# Patient Record
Sex: Male | Born: 1960 | Race: Black or African American | Hispanic: No | Marital: Single | Smoking: Current every day smoker
Health system: Southern US, Community
[De-identification: ages and names within clinical notes are randomized; demographics above are authoritative.]

## PROBLEM LIST (undated history)

## (undated) HISTORY — PX: APPENDECTOMY: SHX54

---

## 2005-07-22 ENCOUNTER — Emergency Department: Payer: Self-pay | Admitting: Emergency Medicine

## 2005-11-28 ENCOUNTER — Emergency Department: Payer: Self-pay | Admitting: Emergency Medicine

## 2008-12-23 ENCOUNTER — Emergency Department: Payer: Self-pay | Admitting: Emergency Medicine

## 2010-06-07 ENCOUNTER — Emergency Department: Payer: Self-pay | Admitting: Emergency Medicine

## 2010-06-23 ENCOUNTER — Ambulatory Visit: Payer: Self-pay | Admitting: Family Medicine

## 2013-08-14 ENCOUNTER — Emergency Department: Payer: Self-pay | Admitting: Emergency Medicine

## 2014-10-05 ENCOUNTER — Emergency Department: Payer: Self-pay | Admitting: Emergency Medicine

## 2020-02-07 ENCOUNTER — Ambulatory Visit: Payer: Self-pay | Attending: Internal Medicine

## 2020-02-07 DIAGNOSIS — Z23 Encounter for immunization: Secondary | ICD-10-CM

## 2020-02-07 NOTE — Progress Notes (Signed)
   Covid-19 Vaccination Clinic  Name:  Adam Neal    MRN: [id number] DOB: Mar 07, 1961  02/07/2020  [name] was observed post Covid-19 immunization for 15 minutes without incident. He was provided with Vaccine Information Sheet and instruction to access the V-Safe system.   [name] was instructed to call 911 with any severe reactions post vaccine: Marland Kitchen Difficulty breathing  . Swelling of face and throat  . A fast heartbeat  . A bad rash all over body  . Dizziness and weakness   Immunizations Administered    Name Date Dose VIS Date Route   Pfizer COVID-19 Vaccine 02/07/2020  8:50 AM 0.3 mL 11/01/2019 Intramuscular   Manufacturer: [employer], [employer]   Lot: [id number]   NDC: [address]-9

## 2020-03-03 ENCOUNTER — Ambulatory Visit: Payer: Self-pay | Attending: Internal Medicine

## 2020-03-03 DIAGNOSIS — Z23 Encounter for immunization: Secondary | ICD-10-CM

## 2020-03-03 NOTE — Progress Notes (Signed)
   Covid-19 Vaccination Clinic  Name:  Adam Neal    MRN: [id number] DOB: 1961-06-30  03/03/2020  [name] was observed post Covid-19 immunization for 15 minutes without incident. He was provided with Vaccine Information Sheet and instruction to access the V-Safe system.   [name] was instructed to call 911 with any severe reactions post vaccine: Marland Kitchen Difficulty breathing  . Swelling of face and throat  . A fast heartbeat  . A bad rash all over body  . Dizziness and weakness   Immunizations Administered    Name Date Dose VIS Date Route   Pfizer COVID-19 Vaccine 03/03/2020  9:31 AM 0.3 mL 11/01/2019 Intramuscular   Manufacturer: [employer], [employer]   Lot: [id number]   NDC: [address]-9

## 2020-03-04 ENCOUNTER — Ambulatory Visit: Payer: Self-pay

## 2021-07-04 ENCOUNTER — Inpatient Hospital Stay: Payer: Self-pay

## 2021-07-04 ENCOUNTER — Other Ambulatory Visit: Payer: Self-pay

## 2021-07-04 ENCOUNTER — Inpatient Hospital Stay
Admission: EM | Admit: 2021-07-04 | Discharge: 2021-07-09 | DRG: 872 | Disposition: A | Discharge status: Home / Self Care | Payer: Self-pay | Attending: Internal Medicine | Admitting: Internal Medicine

## 2021-07-04 ENCOUNTER — Emergency Department: Payer: Self-pay

## 2021-07-04 DIAGNOSIS — E871 Hypo-osmolality and hyponatremia: Secondary | ICD-10-CM

## 2021-07-04 DIAGNOSIS — W109XXA Fall (on) (from) unspecified stairs and steps, initial encounter: Secondary | ICD-10-CM | POA: Diagnosis present

## 2021-07-04 DIAGNOSIS — A419 Sepsis, unspecified organism: Principal | ICD-10-CM

## 2021-07-04 DIAGNOSIS — Y902 Blood alcohol level of 40-59 mg/100 ml: Secondary | ICD-10-CM | POA: Diagnosis present

## 2021-07-04 DIAGNOSIS — Z20822 Contact with and (suspected) exposure to covid-19: Secondary | ICD-10-CM | POA: Diagnosis present

## 2021-07-04 DIAGNOSIS — R17 Unspecified jaundice: Secondary | ICD-10-CM

## 2021-07-04 DIAGNOSIS — D649 Anemia, unspecified: Secondary | ICD-10-CM

## 2021-07-04 DIAGNOSIS — K760 Fatty (change of) liver, not elsewhere classified: Secondary | ICD-10-CM | POA: Diagnosis present

## 2021-07-04 DIAGNOSIS — E872 Acidosis, unspecified: Secondary | ICD-10-CM

## 2021-07-04 DIAGNOSIS — L03115 Cellulitis of right lower limb: Secondary | ICD-10-CM

## 2021-07-04 DIAGNOSIS — E876 Hypokalemia: Secondary | ICD-10-CM

## 2021-07-04 DIAGNOSIS — R739 Hyperglycemia, unspecified: Secondary | ICD-10-CM

## 2021-07-04 DIAGNOSIS — R945 Abnormal results of liver function studies: Secondary | ICD-10-CM

## 2021-07-04 DIAGNOSIS — E46 Unspecified protein-calorie malnutrition: Secondary | ICD-10-CM | POA: Diagnosis present

## 2021-07-04 DIAGNOSIS — F10230 Alcohol dependence with withdrawal, uncomplicated: Secondary | ICD-10-CM | POA: Diagnosis present

## 2021-07-04 DIAGNOSIS — B852 Pediculosis, unspecified: Secondary | ICD-10-CM | POA: Diagnosis present

## 2021-07-04 DIAGNOSIS — Z720 Tobacco use: Secondary | ICD-10-CM

## 2021-07-04 DIAGNOSIS — F17210 Nicotine dependence, cigarettes, uncomplicated: Secondary | ICD-10-CM | POA: Diagnosis present

## 2021-07-04 DIAGNOSIS — F1010 Alcohol abuse, uncomplicated: Secondary | ICD-10-CM

## 2021-07-04 DIAGNOSIS — Z681 Body mass index (BMI) 19 or less, adult: Secondary | ICD-10-CM

## 2021-07-04 DIAGNOSIS — F10930 Alcohol use, unspecified with withdrawal, uncomplicated: Secondary | ICD-10-CM

## 2021-07-04 DIAGNOSIS — L0390 Cellulitis, unspecified: Secondary | ICD-10-CM | POA: Diagnosis present

## 2021-07-04 LAB — COMPREHENSIVE METABOLIC PANEL
ALT: 23 U/L (ref 0–44)
AST: 37 U/L (ref 15–41)
Albumin: 3.5 g/dL (ref 3.5–5.0)
Alkaline Phosphatase: 22 U/L — ABNORMAL LOW (ref 38–126)
Anion gap: 18 — ABNORMAL HIGH (ref 5–15)
BUN: 15 mg/dL (ref 8–23)
CO2: 23 mmol/L (ref 22–32)
Calcium: 8.8 mg/dL — ABNORMAL LOW (ref 8.9–10.3)
Chloride: 86 mmol/L — ABNORMAL LOW (ref 98–111)
Creatinine, Ser: 0.78 mg/dL (ref 0.61–1.24)
GFR, Estimated: >60 mL/min (ref >60)
Glucose, Bld: 102 mg/dL — ABNORMAL HIGH (ref 70–99)
Potassium: 3.1 mmol/L — ABNORMAL LOW (ref 3.5–5.1)
Sodium: 127 mmol/L — ABNORMAL LOW (ref 135–145)
Total Bilirubin: 1.6 mg/dL — ABNORMAL HIGH (ref 0.3–1.2)
Total Protein: 8.7 g/dL — ABNORMAL HIGH (ref 6.5–8.1)

## 2021-07-04 LAB — IRON AND TIBC
Iron: 43 ug/dL — ABNORMAL LOW (ref 45–182)
Saturation Ratios: 24 % (ref 17.9–39.5)
TIBC: 178 ug/dL — ABNORMAL LOW (ref 250–450)
UIBC: 135 ug/dL

## 2021-07-04 LAB — HEMOGLOBIN A1C
Hgb A1c MFr Bld: 4.9 % (ref 4.8–5.6)
Mean Plasma Glucose: 93.93 mg/dL

## 2021-07-04 LAB — FOLATE: Folate: 14.3 ng/mL (ref >5.9)

## 2021-07-04 LAB — CBC WITH DIFFERENTIAL/PLATELET
Abs Immature Granulocytes: 0.1 10*3/uL — ABNORMAL HIGH (ref 0.00–0.07)
Basophils Absolute: 0 10*3/uL (ref 0.0–0.1)
Basophils Relative: 0 %
Eosinophils Absolute: 0.2 10*3/uL (ref 0.0–0.5)
Eosinophils Relative: 1 %
HCT: 32.6 % — ABNORMAL LOW (ref 39.0–52.0)
Hemoglobin: 11.9 g/dL — ABNORMAL LOW (ref 13.0–17.0)
Immature Granulocytes: 1 %
Lymphocytes Relative: 16 %
Lymphs Abs: 2.2 10*3/uL (ref 0.7–4.0)
MCH: 37.2 pg — ABNORMAL HIGH (ref 26.0–34.0)
MCHC: 36.5 g/dL — ABNORMAL HIGH (ref 30.0–36.0)
MCV: 101.9 fL — ABNORMAL HIGH (ref 80.0–100.0)
Monocytes Absolute: 2 10*3/uL — ABNORMAL HIGH (ref 0.1–1.0)
Monocytes Relative: 15 %
Neutro Abs: 8.9 10*3/uL — ABNORMAL HIGH (ref 1.7–7.7)
Neutrophils Relative %: 67 %
Platelets: 235 10*3/uL (ref 150–400)
RBC: 3.2 MIL/uL — ABNORMAL LOW (ref 4.22–5.81)
RDW: 12.8 % (ref 11.5–15.5)
WBC: 13.4 10*3/uL — ABNORMAL HIGH (ref 4.0–10.5)
nRBC: 0.1 % (ref 0.0–0.2)

## 2021-07-04 LAB — GLUCOSE, CAPILLARY: Glucose-Capillary: 98 mg/dL (ref 70–99)

## 2021-07-04 LAB — ETHANOL: Alcohol, Ethyl (B): 45 mg/dL — ABNORMAL HIGH (ref <10)

## 2021-07-04 LAB — LACTIC ACID, PLASMA
Lactic Acid, Venous: 2 mmol/L (ref 0.5–1.9)
Lactic Acid, Venous: 2.1 mmol/L (ref 0.5–1.9)

## 2021-07-04 MED ORDER — GADOBUTROL 1 MMOL/ML IV SOLN
6.0000 mL | Freq: Once | INTRAVENOUS | Status: AC | PRN
  Administered 2021-07-04: 6 mL via INTRAVENOUS

## 2021-07-04 MED ORDER — VANCOMYCIN HCL IN DEXTROSE 1-5 GM/200ML-% IV SOLN
1000.0000 mg | Freq: Once | INTRAVENOUS | Status: DC
  Filled 2021-07-04: qty 200

## 2021-07-04 MED ORDER — LORAZEPAM 2 MG/ML IJ SOLN: 1.0000 mg | INTRAMUSCULAR | Status: AC | PRN

## 2021-07-04 MED ORDER — THIAMINE HCL 100 MG PO TABS
100.0000 mg | ORAL_TABLET | Freq: Every day | ORAL | Status: DC
  Administered 2021-07-04 – 2021-07-08 (×5): 100 mg via ORAL
  Filled 2021-07-04 (×5): qty 1

## 2021-07-04 MED ORDER — FOLIC ACID 1 MG PO TABS
1.0000 mg | ORAL_TABLET | Freq: Every day | ORAL | Status: DC
  Administered 2021-07-04 – 2021-07-08 (×5): 1 mg via ORAL
  Filled 2021-07-04 (×5): qty 1

## 2021-07-04 MED ORDER — LORAZEPAM 1 MG PO TABS: 1.0000 mg | ORAL_TABLET | ORAL | Status: AC | PRN

## 2021-07-04 MED ORDER — THIAMINE HCL 100 MG/ML IJ SOLN: 100.0000 mg | Freq: Every day | INTRAMUSCULAR | Status: DC

## 2021-07-04 MED ORDER — VANCOMYCIN HCL 1250 MG/250ML IV SOLN
1250.0000 mg | Freq: Once | INTRAVENOUS | Status: AC
  Administered 2021-07-04: 1250 mg via INTRAVENOUS
  Filled 2021-07-04: qty 250

## 2021-07-04 MED ORDER — ADULT MULTIVITAMIN W/MINERALS CH
1.0000 | ORAL_TABLET | Freq: Every day | ORAL | Status: DC
  Administered 2021-07-04 – 2021-07-08 (×5): 1 via ORAL
  Filled 2021-07-04 (×5): qty 1

## 2021-07-04 MED ORDER — PIPERACILLIN-TAZOBACTAM 3.375 G IVPB 30 MIN
3.3750 g | Freq: Once | INTRAVENOUS | Status: AC
  Administered 2021-07-04: 3.375 g via INTRAVENOUS
  Filled 2021-07-04: qty 50

## 2021-07-04 MED ORDER — VANCOMYCIN HCL 750 MG/150ML IV SOLN
750.0000 mg | Freq: Two times a day (BID) | INTRAVENOUS | Status: DC
  Administered 2021-07-05: 08:00:00 750 mg via INTRAVENOUS
  Filled 2021-07-04 (×2): qty 150

## 2021-07-04 MED ORDER — LORAZEPAM 2 MG/ML IJ SOLN
1.0000 mg | Freq: Once | INTRAMUSCULAR | Status: AC
  Administered 2021-07-04: 1 mg via INTRAVENOUS
  Filled 2021-07-04: qty 1

## 2021-07-04 MED ORDER — PIPERACILLIN-TAZOBACTAM 3.375 G IVPB
3.3750 g | Freq: Three times a day (TID) | INTRAVENOUS | Status: DC
  Administered 2021-07-05: 05:00:00 3.375 g via INTRAVENOUS
  Filled 2021-07-04: qty 50

## 2021-07-04 MED ORDER — LACTATED RINGERS IV SOLN: INTRAVENOUS | Status: DC

## 2021-07-04 MED ORDER — POTASSIUM CHLORIDE CRYS ER 20 MEQ PO TBCR
40.0000 meq | EXTENDED_RELEASE_TABLET | Freq: Once | ORAL | Status: AC
  Administered 2021-07-04: 40 meq via ORAL
  Filled 2021-07-04: qty 2

## 2021-07-04 MED ORDER — LACTATED RINGERS IV BOLUS
1000.0000 mL | Freq: Once | INTRAVENOUS | Status: AC
  Administered 2021-07-04: 1000 mL via INTRAVENOUS

## 2021-07-04 NOTE — ED Notes (Signed)
Pt being transported to MRI at this time, IV abx to resume on pt return to room.

## 2021-07-04 NOTE — ED Triage Notes (Addendum)
Pt arrives via ems from home. Pt reports wound on rt ankle that is open. Would appears to have been partially wrapped. Drainage noted with foul odor. Pt a&o in triage. Pt denies pain. NAD noted at this time. Pt unsure how long wound has been present.

## 2021-07-04 NOTE — ED Provider Notes (Signed)
[facility] Emergency Department Provider Note   ____________________________________________   Event Date/Time   First MD Initiated Contact with Patient 07/04/21 1733     (approximate)  I have reviewed the triage vital signs and the nursing notes.   HISTORY  Chief Complaint Wound Infection    HPI Adam Neal is a 60 y.o. male with no reported past medical history who presents to the ED complaining of foot wound.  Patient states that 5 days ago he started to notice pain and swelling around his right ankle.  He states that the foot is painful for him to bear weight on and he has noticed drainage from the ankle.  He denies any fevers, chills, nausea, or vomiting.  He denies any history of diabetes.  He denies any recent injuries to his right foot or ankle.  He does admit to daily alcohol consumption, reports a couple of beers and 1 liquor drink per day.        History reviewed. No pertinent past medical history.  There are no problems to display for this patient.   Past Surgical History:  Procedure Laterality Date   APPENDECTOMY      Prior to Admission medications   Not on File    Allergies Patient has no allergy information on record.  History reviewed. No pertinent family history.  Social History Social History   Tobacco Use   Smoking status: Every Day    Types: Cigarettes  Substance Use Topics   Alcohol use: Yes   Drug use: Yes    Types: Marijuana    Review of Systems  Constitutional: No fever/chills Eyes: No visual changes. ENT: No sore throat. Cardiovascular: Denies chest pain. Respiratory: Denies shortness of breath. Gastrointestinal: No abdominal pain.  No nausea, no vomiting.  No diarrhea.  No constipation. Genitourinary: Negative for dysuria. Musculoskeletal: Negative for back pain.  Positive for right foot and ankle pain and swelling. Skin: Negative for rash. Neurological: Negative for headaches, focal weakness  or numbness.  ____________________________________________   PHYSICAL EXAM:  VITAL SIGNS: ED Triage Vitals  Enc Vitals Group     BP 07/04/21 1456 103/74     Pulse Rate 07/04/21 1456 (!) 103     Resp 07/04/21 1456 20     Temp 07/04/21 1456 (!) 97.5 F (36.4 C)     Temp Source 07/04/21 1456 Oral     SpO2 07/04/21 1456 100 %     Weight 07/04/21 1458 135 lb (61.2 kg)     Height 07/04/21 1458 5\' 7"  (1.702 m)     Head Circumference --      Peak Flow --      Pain Score 07/04/21 1458 0     Pain Loc --      Pain Edu? --      Excl. in GC? --     Constitutional: Alert and oriented. Eyes: Conjunctivae are normal. Head: Atraumatic. Nose: No congestion/rhinnorhea. Mouth/Throat: Mucous membranes are moist. Neck: Normal ROM Cardiovascular: Normal rate, regular rhythm. Grossly normal heart sounds. Respiratory: Normal respiratory effort.  No retractions. Lungs CTAB. Gastrointestinal: Soft and nontender. No distention. Genitourinary: deferred Musculoskeletal: Edema, erythema, and warmth noted to distal right lower extremity at mid calf and extending throughout right foot.  Ulceration noted between all 5 toes with maggots.  There is also ulceration circumferentially around his right ankle. Neurologic:  Normal speech and language. No gross focal neurologic deficits are appreciated.  Tremulous with tongue fasciculations. Skin:  Skin is  warm, dry and intact. No rash noted. Psychiatric: Mood and affect are normal. Speech and behavior are normal.  ____________________________________________   LABS (all labs ordered are listed, but only abnormal results are displayed)  Labs Reviewed  LACTIC ACID, PLASMA - Abnormal; Notable for the following components:      Result Value   Lactic Acid, Venous 2.0 (*)    All other components within normal limits  COMPREHENSIVE METABOLIC PANEL - Abnormal; Notable for the following components:   Sodium 127 (*)    Potassium 3.1 (*)    Chloride 86 (*)     Glucose, Bld 102 (*)    Calcium 8.8 (*)    Total Protein 8.7 (*)    Alkaline Phosphatase 22 (*)    Total Bilirubin 1.6 (*)    Anion gap 18 (*)    All other components within normal limits  CBC WITH DIFFERENTIAL/PLATELET - Abnormal; Notable for the following components:   WBC 13.4 (*)    RBC 3.20 (*)    Hemoglobin 11.9 (*)    HCT 32.6 (*)    MCV 101.9 (*)    MCH 37.2 (*)    MCHC 36.5 (*)    Neutro Abs 8.9 (*)    Monocytes Absolute 2.0 (*)    Abs Immature Granulocytes 0.10 (*)    All other components within normal limits  SARS CORONAVIRUS 2 (TAT 6-24 HRS)  CULTURE, BLOOD (ROUTINE X 2)  CULTURE, BLOOD (ROUTINE X 2)  LACTIC ACID, PLASMA  URINALYSIS, COMPLETE (UACMP) WITH MICROSCOPIC  ETHANOL    PROCEDURES  Procedure(s) performed (including Critical Care):  Procedures   ____________________________________________   INITIAL IMPRESSION / ASSESSMENT AND PLAN / ED COURSE      60 year old male with no reported past medical history presents to the ED complaining of pain and swelling in his right ankle and foot.  He has significant ulceration between all 5 of his toes as well as circumferentially around his ankle, maggots noted between his toes.  X-ray of right ankle reviewed by me with no fracture or dislocation, no obvious signs of osteomyelitis however I am concerned that he is developing osteomyelitis with the severity of his ulceration and wounds.  We will start on broad-spectrum antibiotics, send blood cultures.  We will hydrate with IV fluids and patient also appears to be developing early alcohol withdrawal, will give dose of IV Ativan.  Plan to discuss with hospitalist for admission.      ____________________________________________   FINAL CLINICAL IMPRESSION(S) / ED DIAGNOSES  Final diagnoses:  Cellulitis of right foot  Alcohol withdrawal syndrome without complication [facility])     ED Discharge Orders     None        Note:  This document was prepared using  Dragon voice recognition software and may include unintentional dictation errors.    Chesley Noon, MD 07/04/21 [phone]

## 2021-07-04 NOTE — H&P (Signed)
History and Physical    Adam Neal ZDG:[id number] DOB: Oct 23, 1961 DOA: 07/04/2021  PCP: Pcp, No  Patient coming from: Home  I have personally briefly reviewed patient's old medical records in [facility]  Chief Complaint: right   HPI: Adam Neal is a 60 y.o. male with no reported medical history who presents with concerns of right ankle wound.  Patient has not seen a physician for many years.  States he fell about a week ago while walking up the stairs and injured his right ankle.  Then about 4 days ago he noticed purulent drainage coming from his right foot.  Today he was advised by a friend to be evaluated in the ED.  He denies feeling any pain but also denies any numbness or tingling.  Has been having chills but no fever.  No nausea, vomiting or diarrhea.  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  He endorsed half a pack tobacco use for about 40 years.  Also drinks about half a pint of alcohol daily for the past 40 years.  Last drink was this morning.  Has been feeling tremulous since this afternoon.  Also endorsed marijuana use but a week ago.  ED Course: He was afebrile, mildly tachycardic and normotensive on room air.  WBC of 13.4, hemoglobin of 11.9.  Lactate of 2. Sodium of 127, K of 3.1, normal creatinine of 0.70, BG 102 with gap of 18.  Total bilirubin mildly elevated 1.6.  LFTs otherwise normal.  Patient was noted to have a malodorous wound wrapping around his right ankle circumferentially with maggots around his toes. X-ray of the right ankle was negative for osteomyelitis.  He was started on IV vancomycin and Zosyn for cellulitis and hospitalist Admission.  Review of Systems: Constitutional: No Weight Change, No Fever ENT/Mouth: No sore throat, No Rhinorrhea Eyes: No Eye Pain, No Vision Changes Cardiovascular: No Chest Pain, no SOB Respiratory: No Cough  Gastrointestinal: No Nausea, No Vomiting, No Diarrhea, No Pain Genitourinary: no  Urinary Incontinence Musculoskeletal: No Arthralgias, No Myalgias Skin: No Skin Lesions, No Pruritus, Neuro: no Weakness, No Numbness Psych: No Anxiety/Panic, No Depression, no decrease appetite Heme/Lymph: No Bruising, No Bleeding  Past Surgical History:  Procedure Laterality Date   APPENDECTOMY       reports that he has been smoking cigarettes. He does not have any smokeless tobacco history on file. He reports current alcohol use. He reports current drug use. Drug: Marijuana. Social History  Not on File  History reviewed. No pertinent family history.   Prior to Admission medications   Not on File    Physical Exam: Vitals:   07/04/21 1456 07/04/21 1458  BP: 103/74   Pulse: (!) 103   Resp: 20   Temp: (!) 97.5 F (36.4 C)   TempSrc: Oral   SpO2: 100%   Weight:  61.2 kg  Height:  5\' 7"  (1.702 m)    Constitutional: NAD, calm, comfortable, nontoxic thin elderly male laying at 22 incline in bed Vitals:   07/04/21 1456 07/04/21 1458  BP: 103/74   Pulse: (!) 103   Resp: 20   Temp: (!) 97.5 F (36.4 C)   TempSrc: Oral   SpO2: 100%   Weight:  61.2 kg  Height:  5\' 7"  (1.702 m)   Eyes: PERRL, lids and conjunctivae normal ENMT: Mucous membranes are moist.  Poor and missing dentition throughout  neck: normal, supple Respiratory: clear to auscultation bilaterally, no wheezing, no crackles. Normal respiratory  effort. No accessory muscle use.  Cardiovascular: Regular rate and rhythm, no murmurs / rubs / gallops.  Bilateral nonpitting edema of the ankle-Patient had deep imprints of socks that he previously wore. abdomen: no tenderness, no masses palpated. Bowel sounds positive.  Musculoskeletal: no clubbing / cyanosis. No joint deformity upper and lower extremities.  Skin: Large erythematous circumferential wound wrapping around right distal lower extremity/ankle with purulent drainage and malodor.  There was also maggots between toes of his right foot. Neurologic: CN 2-12  grossly intact. Sensation intact,  Strength 5/5 in all 4.  Psychiatric: Normal judgment and insight. Alert and oriented x 3. Normal mood.     Labs on Admission: I have personally reviewed following labs and imaging studies  CBC: Recent Labs  Lab 07/04/21 1504  WBC 13.4*  NEUTROABS 8.9*  HGB 11.9*  HCT 32.6*  MCV 101.9*  PLT 235   Basic Metabolic Panel: Recent Labs  Lab 07/04/21 1504  NA 127*  K 3.1*  CL 86*  CO2 23  GLUCOSE 102*  BUN 15  CREATININE 0.78  CALCIUM 8.8*   GFR: Estimated Creatinine Clearance: 83.9 mL/min (by C-G formula based on SCr of 0.78 mg/dL). Liver Function Tests: Recent Labs  Lab 07/04/21 1504  AST 37  ALT 23  ALKPHOS 22*  BILITOT 1.6*  PROT 8.7*  ALBUMIN 3.5   No results for input(s): LIPASE, AMYLASE in the last 168 hours. No results for input(s): AMMONIA in the last 168 hours. Coagulation Profile: No results for input(s): INR, PROTIME in the last 168 hours. Cardiac Enzymes: No results for input(s): CKTOTAL, CKMB, CKMBINDEX, TROPONINI in the last 168 hours. BNP (last 3 results) No results for input(s): PROBNP in the last 8760 hours. HbA1C: No results for input(s): HGBA1C in the last 72 hours. CBG: No results for input(s): GLUCAP in the last 168 hours. Lipid Profile: No results for input(s): CHOL, HDL, LDLCALC, TRIG, CHOLHDL, LDLDIRECT in the last 72 hours. Thyroid Function Tests: No results for input(s): TSH, T4TOTAL, FREET4, T3FREE, THYROIDAB in the last 72 hours. Anemia Panel: No results for input(s): VITAMINB12, FOLATE, FERRITIN, TIBC, IRON, RETICCTPCT in the last 72 hours. Urine analysis: No results found for: COLORURINE, APPEARANCEUR, LABSPEC, PHURINE, GLUCOSEU, HGBUR, BILIRUBINUR, KETONESUR, PROTEINUR, UROBILINOGEN, NITRITE, LEUKOCYTESUR  Radiological Exams on Admission: DG Ankle Complete Right  Result Date: 07/04/2021 CLINICAL DATA:  Open wound on the right ankle. EXAM: RIGHT ANKLE - COMPLETE 3+ VIEW COMPARISON:  None.  FINDINGS: There is no evidence of fracture, dislocation, or joint effusion. No focal osseous demineralization to suggest osteomyelitis. A plantar calcaneal enthesophyte is noted. The bones are demineralized. There is soft tissue swelling surrounding the ankle. IMPRESSION: No evidence of osteomyelitis. Electronically Signed   By: Romona Curls M.D.   On: 07/04/2021 15:43      Assessment/Plan  Sepsis secondary to right ankle cellulitis -Patient presented with mild tachycardia and leukocytosis with cellulitis -X-ray negative for osteomyelitis but given the appearance of his wound with maggots and lack of medical care will obtain MRI of the right ankle to rule out osteomyelitis -Continue IV vancomycin and Zosyn -Wound consult  Hyponatremia - Likely due to heavy alcohol use - We will replete with IV fluids and repeat morning  Hypokalemia - Replete with oral supplementation  Mild hyperglycemia - Check hemoglobin A1c to assess for potential diabetes especially patient has not seek out medical care for many years  Metabolic acidosis - Gap of 18 - Likely secondary to heavy alcohol use.  We will gentle  hydrate with IV continuous fluid  Anemia - Hemoglobin of 11.9 - Will check iron panel, vitamin B12 and folate  Elevated total bilirubin - Bilirubin mildly elevated 1.6.  Likely due to heavy alcohol use.  Benign abdominal exam.  We will follow with repeat CMP in the morning.  Alcohol abuse - Patient endorsed using a pint of alcohol daily for the past 40 years -start CIWA protocol.  Has already received 2 mg of IV Ativan in the ED. -give thiamine, folate and multivitamin  Tobacco abuse -smoke 1/2 pack for 40 years. Give nicotine patch.   DVT prophylaxis:.Lovenox Code Status: Full Family Communication: Plan discussed with patient at bedside  disposition Plan: Home with observation Consults called:  Admission status: Observation  Level of care: Progressive Cardiac  Status is:  Inpatient  Remains inpatient appropriate because:Inpatient level of care appropriate due to severity of illness  Dispo: The patient is from: Home              Anticipated d/c is to: Home              Patient currently is not medically stable to d/c.   Difficult to place patient No         Anselm Jungling DO [facility]   If 7PM-7AM, please contact night-coverage [email or web address]   07/04/2021, 6:48 PM

## 2021-07-04 NOTE — ED Notes (Signed)
Pt returned to room, vitals updated, receiving RN made aware

## 2021-07-04 NOTE — ED Notes (Signed)
MD [facility] [place] informed of pt's lactic result at this time.

## 2021-07-04 NOTE — Progress Notes (Signed)
Pharmacy Antibiotic Note  Adam Neal is a 60 y.o. male admitted on 07/04/2021 with cellulitis. Pt noted to have significant ulceration between all 5 of his toes as well as circumferentially around his ankle, maggots noted between his toes. Pharmacy has been consulted for Vancomycin and Zosyn dosing.  8/14 R ankle Xray: No evidence of fracture, dislocation, or joint effusion. No evidence of osteomyelitis  Plan: Start Zosyn 3.375g IV q8h (4 hour infusion) Start vancomycin 1250 mg IV x1 loading dose followed by 750 mg q12h - plan for 7 days Est AUC: 459.6 Scr 0.8, Vd 0.72 L/kg, TBW for CrCl/Ke Obtain vancomycin levels around 4th or 5th dose if continued Monitor renal function and adjust dose as clinically indicated  Follow up MRI ankle    Height: 5\' 7"  (170.2 cm) Weight: 61.2 kg (135 lb) IBW/kg (Calculated) : 66.1  Temp (24hrs), Avg:97.5 F (36.4 C), Min:97.5 F (36.4 C), Max:97.5 F (36.4 C)  Recent Labs  Lab 07/04/21 1504  WBC 13.4*  CREATININE 0.78  LATICACIDVEN 2.0*    Estimated Creatinine Clearance: 83.9 mL/min (by C-G formula based on SCr of 0.78 mg/dL).    No Known Allergies  Antimicrobials this admission: 8/14 Zosyn >> 8/14 Vancomycin >>  Dose adjustments this admission:   Microbiology results: 8/14 BCx: sent   Thank you for allowing pharmacy to be a part of this patient's care.  9/14 Adam Neal 07/04/2021 7:13 PM

## 2021-07-05 DIAGNOSIS — L03115 Cellulitis of right lower limb: Secondary | ICD-10-CM

## 2021-07-05 DIAGNOSIS — F10230 Alcohol dependence with withdrawal, uncomplicated: Secondary | ICD-10-CM

## 2021-07-05 DIAGNOSIS — F10930 Alcohol use, unspecified with withdrawal, uncomplicated: Secondary | ICD-10-CM

## 2021-07-05 LAB — COMPREHENSIVE METABOLIC PANEL
ALT: 17 U/L (ref 0–44)
AST: 35 U/L (ref 15–41)
Albumin: 2.7 g/dL — ABNORMAL LOW (ref 3.5–5.0)
Alkaline Phosphatase: 19 U/L — ABNORMAL LOW (ref 38–126)
Anion gap: 13 (ref 5–15)
BUN: 11 mg/dL (ref 8–23)
CO2: 25 mmol/L (ref 22–32)
Calcium: 8.6 mg/dL — ABNORMAL LOW (ref 8.9–10.3)
Chloride: 92 mmol/L — ABNORMAL LOW (ref 98–111)
Creatinine, Ser: 0.78 mg/dL (ref 0.61–1.24)
GFR, Estimated: >60 mL/min (ref >60)
Glucose, Bld: 82 mg/dL (ref 70–99)
Potassium: 3.9 mmol/L (ref 3.5–5.1)
Sodium: 130 mmol/L — ABNORMAL LOW (ref 135–145)
Total Bilirubin: 2 mg/dL — ABNORMAL HIGH (ref 0.3–1.2)
Total Protein: 6.8 g/dL (ref 6.5–8.1)

## 2021-07-05 LAB — HIV ANTIBODY (ROUTINE TESTING W REFLEX): HIV Screen 4th Generation wRfx: NONREACTIVE

## 2021-07-05 LAB — URINALYSIS, COMPLETE (UACMP) WITH MICROSCOPIC
Bacteria, UA: NONE SEEN
Bilirubin Urine: NEGATIVE
Glucose, UA: NEGATIVE mg/dL
Hgb urine dipstick: NEGATIVE
Ketones, ur: 20 mg/dL — AB
Leukocytes,Ua: NEGATIVE
Nitrite: NEGATIVE
Protein, ur: NEGATIVE mg/dL
Specific Gravity, Urine: 1.034 — ABNORMAL HIGH (ref 1.005–1.030)
pH: 5 (ref 5.0–8.0)

## 2021-07-05 LAB — CBC
HCT: 29.2 % — ABNORMAL LOW (ref 39.0–52.0)
Hemoglobin: 10.7 g/dL — ABNORMAL LOW (ref 13.0–17.0)
MCH: 37.4 pg — ABNORMAL HIGH (ref 26.0–34.0)
MCHC: 36.6 g/dL — ABNORMAL HIGH (ref 30.0–36.0)
MCV: 102.1 fL — ABNORMAL HIGH (ref 80.0–100.0)
Platelets: 236 10*3/uL (ref 150–400)
RBC: 2.86 MIL/uL — ABNORMAL LOW (ref 4.22–5.81)
RDW: 12.7 % (ref 11.5–15.5)
WBC: 10.6 10*3/uL — ABNORMAL HIGH (ref 4.0–10.5)
nRBC: 0.2 % (ref 0.0–0.2)

## 2021-07-05 LAB — VITAMIN B12: Vitamin B-12: 219 pg/mL (ref 180–914)

## 2021-07-05 LAB — SARS CORONAVIRUS 2 (TAT 6-24 HRS): SARS Coronavirus 2: NEGATIVE

## 2021-07-05 MED ORDER — ENOXAPARIN SODIUM 40 MG/0.4ML IJ SOSY
40.0000 mg | PREFILLED_SYRINGE | INTRAMUSCULAR | Status: DC
  Administered 2021-07-05 – 2021-07-07 (×3): 40 mg via SUBCUTANEOUS
  Filled 2021-07-05 (×3): qty 0.4

## 2021-07-05 MED ORDER — NICOTINE 14 MG/24HR TD PT24
14.0000 mg | MEDICATED_PATCH | Freq: Every day | TRANSDERMAL | Status: DC
  Administered 2021-07-05 – 2021-07-08 (×4): 14 mg via TRANSDERMAL
  Filled 2021-07-05 (×4): qty 1

## 2021-07-05 MED ORDER — COVID-19 MRNA VAC-TRIS(PFIZER) 30 MCG/0.3ML IM SUSP
0.3000 mL | Freq: Once | INTRAMUSCULAR | Status: AC
  Filled 2021-07-05: qty 0.3

## 2021-07-05 MED ORDER — SILVER SULFADIAZINE 1 % EX CREA
TOPICAL_CREAM | Freq: Two times a day (BID) | CUTANEOUS | Status: DC
  Administered 2021-07-05 – 2021-07-07 (×3): 1 via TOPICAL
  Filled 2021-07-05 (×2): qty 85

## 2021-07-05 MED ORDER — VANCOMYCIN HCL 1250 MG/250ML IV SOLN
1250.0000 mg | INTRAVENOUS | Status: DC
  Administered 2021-07-05 – 2021-07-07 (×3): 1250 mg via INTRAVENOUS
  Filled 2021-07-05 (×4): qty 250

## 2021-07-05 MED ORDER — CYANOCOBALAMIN 1000 MCG/ML IJ SOLN
1000.0000 ug | Freq: Once | INTRAMUSCULAR | Status: AC
  Administered 2021-07-05: 1000 ug via INTRAMUSCULAR
  Filled 2021-07-05: qty 1

## 2021-07-05 NOTE — Progress Notes (Deleted)
Pt states his RLE wound from "a fall".

## 2021-07-05 NOTE — Consult Note (Signed)
WOC Nurse Consult Note: Patient receiving care in [facility]. Reason for Consult: right ankle wound and a "mushy left foot" Wound type: no wounds found on left foot or between toes of either foot.  There was a circumferential wound on the RLE at the level of the ankle and slightly above and below it. It was 13 cm in length and went completely around the RLE.  I asked the patient if he had any idea for what caused the wound, and he did not. Pressure Injury POA: Yes/No/NA Measurement: Wound bed: primarily pink, moist, but there are some areas of yellow and brown slough. Drainage (amount, consistency, odor) none Periwound: intact; no maggots found on either foot. Dressing procedure/placement/frequency:Apply Triple Paste to RLE wound areas. Cover with dry gauze, secure with kerlex. To be performed twice daily. WOC nurse will not follow at this time.  Please re-consult the WOC team if needed.  Helmut Muster, RN, MSN, CWOCN, CNS-BC, pager [phone]

## 2021-07-05 NOTE — Progress Notes (Signed)
Accidentally deleted my notes from this morning. They are still accurate.

## 2021-07-05 NOTE — TOC Initial Note (Addendum)
Transition of Care [facility]) - Initial/Assessment Note    Patient Details  Name: Adam Neal MRN: [id number] Date of Birth: Apr 12, 1961  [facility] CM/SW Contact:    Allayne Butcher, RN Phone Number: 07/05/2021, 2:59 PM  Clinical Narrative:                 Patient admitted to the hospital with wound cellulitis.  RNCM received consult for substance abuse counseling.  Patient's brother also requested to speak with RNCM.  Adam Neal patient's brother at the bedside today, he reports he wants to get help for his brother.  He reports that the patient just drinks alcohol all the time, patient does not work and does not have any income at this time he is year away from being able to receive social security.   Adam Neal reports that patient gets alcohol from his friends. Per Adam Neal the patient's home is uninhabitable with no running water.  Patient noted to have bed bugs and lice and maggots in his would at admission.   RNCM will reach out to DSS about home environment.  Brother given resources for substance abuse treatment and information on [facility] and [facility].  Referral will be made to both.   Brother is hoping that patient will go to their mother's at discharge as they do not want him to return to his home.     Expected Discharge Plan: Home w Home Health Services Barriers to Discharge: Continued Medical Work up   Patient Goals and CMS Choice        Expected Discharge Plan and Services Expected Discharge Plan: Home w Home Health Services   Discharge Planning Services: CM Consult, Indigent Health Clinic, Medication Assistance   Living arrangements for the past 2 months: Single Family Home                 DME Arranged: N/A DME Agency: NA                  Prior Living Arrangements/Services Living arrangements for the past 2 months: Single Family Home Lives with:: Self Patient language and need for interpreter reviewed:: Yes Do you feel safe  going back to the place where you live?: Yes      Need for Family Participation in Patient Care: Yes (Comment) (alcohol dependence, leg wound) Care giver support system in place?: Yes (comment) (brother and mother)   Criminal Activity/Legal Involvement Pertinent to Current Situation/Hospitalization: No - Comment as needed  Activities of Daily Living Home Assistive Devices/Equipment: None ADL Screening (condition at time of admission) Patient's cognitive ability adequate to safely complete daily activities?: No Is the patient deaf or have difficulty hearing?: No Does the patient have difficulty seeing, even when wearing glasses/contacts?: No Does the patient have difficulty concentrating, remembering, or making decisions?: Yes Patient able to express need for assistance with ADLs?: No Does the patient have difficulty dressing or bathing?: Yes Independently performs ADLs?: No Communication: Appropriate for developmental age Dressing (OT): Needs assistance Is this a change from baseline?: Change from baseline, expected to last <3days Grooming: Needs assistance Is this a change from baseline?: Change from baseline, expected to last <3 days Feeding: Independent Bathing: Needs assistance Is this a change from baseline?: Change from baseline, expected to last <3 days Toileting: Needs assistance Is this a change from baseline?: Change from baseline, expected to last <3 days In/Out Bed: Needs assistance Is this a change from baseline?: Change from baseline, expected to last <3  days Walks in Home: Needs assistance Is this a change from baseline?: Change from baseline, expected to last <3 days Does the patient have difficulty walking or climbing stairs?: Yes Weakness of Legs: Both Weakness of Arms/Hands: None  Permission Sought/Granted Permission sought to share information with : Case Manager, Family Supports, Other (comment) Permission granted to share information with : Yes, Verbal  Permission Granted  Share Information with NAME: Adam Neal     Permission granted to share info w Relationship: Brother     Emotional Assessment       Orientation: : Oriented to Self, Oriented to Place Alcohol / Substance Use: Alcohol Use, Tobacco Use Psych Involvement: No (comment)  Admission diagnosis:  Cellulitis of right foot [L03.115] Wound cellulitis [L03.90] Alcohol withdrawal syndrome without complication (HCC) [F10.230] Patient Active Problem List   Diagnosis Date Noted   Cellulitis of right foot    Alcohol withdrawal syndrome without complication (HCC)    Wound cellulitis 07/04/2021   Sepsis (HCC) 07/04/2021   Hyponatremia 07/04/2021   Hypokalemia 07/04/2021   Hyperglycemia 07/04/2021   Metabolic acidosis 07/04/2021   Anemia 07/04/2021   Serum total bilirubin elevated 07/04/2021   Alcohol abuse 07/04/2021   Tobacco use 07/04/2021   PCP:  Pcp, No Pharmacy:   [id number] [place], [place] - [address] AT [facility] [address] [place] [address] Phone: [phone] Fax: [phone]     Social Determinants of Health (SDOH) Interventions    Readmission Risk Interventions No flowsheet data found.

## 2021-07-05 NOTE — Progress Notes (Addendum)
PROGRESS NOTE    Adam Neal  TTS:[id number] DOB: 11-14-1961 DOA: 07/04/2021 PCP: Pcp, No   Brief Narrative: Taken from H&P. Adam Neal is a 60 y.o. male with no reported medical history who presents with concerns of right ankle wound.   Patient has not seen a physician for many years.  States he fell about a week ago while walking up the stairs and injured his right ankle.  Then about 4 days ago he noticed purulent drainage coming from his right foot.  Today he was advised by a friend to be evaluated in the ED. 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  Smoke & Drink about half a pint of alcohol daily for the past 40 years.  Also uses marijuana.  Patient found to have mild leukocytosis, mild lactic acidosis at 2, hyponatremia, hypokalemia and mildly elevated T bili.  He was having email address wound wrapped around the right ankle circumferentially with maggots around his toes.  Also found to have some bedbugs and lice. X-ray and MRI was negative for osteomyelitis.  Initially received IV vancomycin and Zosyn. I stopped the Zosyn and will continue with IV vancomycin based on cellulitis with purulent discharge recommendations.  No history of diabetes, A1c of 4.9. Wound and blood cultures pending.  Subjective: Patient was seen and examined today.  Denies any pain.  He does not know how he get that wound which involves the whole circumference of his ankle.  Stating that he fell from 3 stairs, appears to have some cognitive impairment.  Able to answer orientation questions appropriately.  Assessment & Plan:   Active Problems:   Wound cellulitis   Sepsis (HCC)   Hyponatremia   Hypokalemia   Hyperglycemia   Metabolic acidosis   Anemia   Serum total bilirubin elevated   Alcohol abuse   Tobacco use  Sepsis secondary to right ankle cellulitis -Patient presented with mild tachycardia and leukocytosis with a right ankle circumferential wound infection.  Mild lactic  acidosis which has been resolved.  Patient received IV fluid and broad-spectrum antibiotics with vancomycin and Zosyn. Wound care was obtained-appreciate their recommendations for twice daily. Ordered wound culture-patient already received couple of doses of antibiotics. Blood cultures pending. X-ray and MRI negative for osteomyelitis. -DC Zosyn. -Continue with vancomycin for now. -Follow-up with cultures  Hyponatremia.Most likely secondary to excessive alcohol use.  Sodium at 130 today. -Continue to monitor  Macrocytic anemia.  Most likely secondary to alcohol use.  Borderline B12 deficiency on anemia panel.  Mildly low iron with low TIBC which makes it anemia of chronic disease. -B12 supplement.  Elevated T bili.  Liver enzymes within normal limit.  Most likely secondary to alcohol abuse, no prior diagnosis of cirrhosis. -Liver ultrasound  Alcohol abuse.  Patient endorsed using a pint of alcohol daily for the past 40 years.  High risk for alcohol withdrawal. -Continue with CIWA protocol -Continue with thiamine and folate supplement  Tobacco abuse. -Nicotine patch  Protein caloric malnutrition. Estimated body mass index is 18.58 kg/m as calculated from the following:   Height as of this encounter: 5\' 7"  (1.702 m).   Weight as of this encounter: 53.8 kg.  -Dietitian consult  Objective: Vitals:   07/04/21 2339 07/04/21 2351 07/05/21 0621 07/05/21 0720  BP: 119/72 119/72 111/76 108/69  Pulse: 93 99 94 79  Resp: 16 16 18 16   Temp: 98.8 F (37.1 C) 98.8 F (37.1 C) 98 F (36.7 C) 97.8 F (36.6 C)  TempSrc: Oral Oral    SpO2:  93% 100% 100%  Weight:      Height:        Intake/Output Summary (Last 24 hours) at 07/05/2021 1250 Last data filed at 07/04/2021 2319 Gross per 24 hour  Intake --  Output 200 ml  Net -200 ml   Filed Weights   07/04/21 1458 07/04/21 2140  Weight: 61.2 kg 53.8 kg    Examination:  General exam: Appears calm and comfortable  Respiratory  system: Clear to auscultation. Respiratory effort normal. Cardiovascular system: S1 & S2 heard, RRR.  Gastrointestinal system: Soft, nontender, nondistended, bowel sounds positive. Central nervous system: Alert and oriented. No focal neurological deficits.Symmetric 5 x 5 power. Extremities: No edema, no cyanosis, pulses intact and symmetrical. Skin: Right ankle circumferential wound involving all around, mild erythema and with some purulent drainage. Psychiatry: Judgement and insight appear impaired.  DVT prophylaxis: Lovenox Code Status: Full Family Communication: Discussed with brother on phone. Disposition Plan:  Status is: Inpatient  Remains inpatient appropriate because:Inpatient level of care appropriate due to severity of illness  Dispo: The patient is from: Home              Anticipated d/c is to: Home              Patient currently is not medically stable to d/c.   Difficult to place patient No              Level of care: Med-Surg  All the records are reviewed and case discussed with Care Management/Social Worker. Management plans discussed with the patient, nursing and they are in agreement.  Consultants:  None  Procedures:  Antimicrobials:  Vancomycin  Data Reviewed: I have personally reviewed following labs and imaging studies  CBC: Recent Labs  Lab 07/04/21 1504 07/05/21 0649  WBC 13.4* 10.6*  NEUTROABS 8.9*  --   HGB 11.9* 10.7*  HCT 32.6* 29.2*  MCV 101.9* 102.1*  PLT 235 236   Basic Metabolic Panel: Recent Labs  Lab 07/04/21 1504 07/05/21 0649  NA 127* 130*  K 3.1* 3.9  CL 86* 92*  CO2 23 25  GLUCOSE 102* 82  BUN 15 11  CREATININE 0.78 0.78  CALCIUM 8.8* 8.6*   GFR: Estimated Creatinine Clearance: 73.8 mL/min (by C-G formula based on SCr of 0.78 mg/dL). Liver Function Tests: Recent Labs  Lab 07/04/21 1504 07/05/21 0649  AST 37 35  ALT 23 17  ALKPHOS 22* 19*  BILITOT 1.6* 2.0*  PROT 8.7* 6.8  ALBUMIN 3.5 2.7*   No results for  input(s): LIPASE, AMYLASE in the last 168 hours. No results for input(s): AMMONIA in the last 168 hours. Coagulation Profile: No results for input(s): INR, PROTIME in the last 168 hours. Cardiac Enzymes: No results for input(s): CKTOTAL, CKMB, CKMBINDEX, TROPONINI in the last 168 hours. BNP (last 3 results) No results for input(s): PROBNP in the last 8760 hours. HbA1C: Recent Labs    07/04/21 2130  HGBA1C 4.9   CBG: Recent Labs  Lab 07/04/21 2216  GLUCAP 98   Lipid Profile: No results for input(s): CHOL, HDL, LDLCALC, TRIG, CHOLHDL, LDLDIRECT in the last 72 hours. Thyroid Function Tests: No results for input(s): TSH, T4TOTAL, FREET4, T3FREE, THYROIDAB in the last 72 hours. Anemia Panel: Recent Labs    07/04/21 2130  VITAMINB12 219  FOLATE 14.3  TIBC 178*  IRON 43*   Sepsis Labs: Recent Labs  Lab 07/04/21 1504 07/04/21 1854  LATICACIDVEN 2.0* 2.1*  Recent Results (from the past 240 hour(s))  SARS CORONAVIRUS 2 (TAT 6-24 HRS) Nasopharyngeal Nasopharyngeal Swab     Status: None   Collection Time: 07/04/21  6:54 PM   Specimen: Nasopharyngeal Swab  Result Value Ref Range Status   SARS Coronavirus 2 NEGATIVE NEGATIVE Final    Comment: (NOTE) SARS-CoV-2 target nucleic acids are NOT DETECTED.  The SARS-CoV-2 RNA is generally detectable in upper and lower respiratory specimens during the acute phase of infection. Negative results do not preclude SARS-CoV-2 infection, do not rule out co-infections with other pathogens, and should not be used as the sole basis for treatment or other patient management decisions. Negative results must be combined with clinical observations, patient history, and epidemiological information. The expected result is Negative.  Fact Sheet for Patients: HairSlick.no  Fact Sheet for Healthcare Providers: quierodirigir.com  This test is not yet approved or cleared by the [place]  FDA and  has been authorized for detection and/or diagnosis of SARS-CoV-2 by FDA under an Emergency Use Authorization (EUA). This EUA will remain  in effect (meaning this test can be used) for the duration of the COVID-19 declaration under Se ction 564(b)(1) of the Act, 21 U.S.C. section 360bbb-3(b)(1), unless the authorization is terminated or revoked sooner.  Performed at [facility], [address], [place], [place] [address]      Radiology Studies: DG Ankle Complete Right  Result Date: 07/04/2021 CLINICAL DATA:  Open wound on the right ankle. EXAM: RIGHT ANKLE - COMPLETE 3+ VIEW COMPARISON:  None. FINDINGS: There is no evidence of fracture, dislocation, or joint effusion. No focal osseous demineralization to suggest osteomyelitis. A plantar calcaneal enthesophyte is noted. The bones are demineralized. There is soft tissue swelling surrounding the ankle. IMPRESSION: No evidence of osteomyelitis. Electronically Signed   By: Romona Curls M.D.   On: 07/04/2021 15:43   MR ANKLE RIGHT W WO CONTRAST  Result Date: 07/05/2021 CLINICAL DATA:  Right ankle wound. EXAM: MRI OF THE RIGHT ANKLE WITHOUT AND WITH CONTRAST TECHNIQUE: Multiplanar, multisequence MR imaging of the ankle was performed before and after the administration of intravenous contrast. CONTRAST:  40mL GADAVIST GADOBUTROL 1 MMOL/ML IV SOLN COMPARISON:  Right ankle x-rays from same day. FINDINGS: TENDONS Peroneal: Peroneal longus tendon intact. Peroneal brevis intact. Posteromedial: Posterior tibial tendon intact. Flexor digitorum longus tendon intact. Flexor hallucis longus tendon intact. Anterior: Tibialis anterior tendon intact. Extensor hallucis longus tendon intact Extensor digitorum longus tendon intact. Achilles:  Intact. Plantar Fascia: Intact. LIGAMENTS Lateral: Anterior talofibular ligament intact. Calcaneofibular ligament intact. Posterior talofibular ligament intact. Anterior and posterior tibiofibular ligaments intact.  Medial: Deltoid ligament intact. Spring ligament intact. CARTILAGE Ankle Joint: No joint effusion. Normal ankle mortise. No chondral defect. Subtalar Joints/Sinus Tarsi: Normal subtalar joints. No subtalar joint effusion. Normal sinus tarsi. Bones: No marrow signal abnormality.  No fracture or dislocation. Soft Tissue: Diffuse soft tissue swelling with mild enhancement. No fluid collection or soft tissue mass. IMPRESSION: IMPRESSION 1. Cellulitis. No abscess or osteomyelitis. Electronically Signed   By: Obie Dredge M.D.   On: 07/05/2021 05:05    Scheduled Meds:  enoxaparin (LOVENOX) injection  40 mg Subcutaneous Q24H   folic acid  1 mg Oral Daily   multivitamin with minerals  1 tablet Oral Daily   nicotine  14 mg Transdermal Daily   silver sulfADIAZINE   Topical BID   thiamine  100 mg Oral Daily   Or   thiamine  100 mg Intravenous Daily   Continuous Infusions:  lactated ringers 50 mL/hr at 07/04/21 2148   vancomycin 750 mg (07/05/21 0825)     LOS: 1 day   Time spent: 40 minutes. More than 50% of the time was spent in counseling/coordination of care  Arnetha Courser, MD [facility]  If 7PM-7AM, please contact night-coverage [email or web address]  07/05/2021, 12:50 PM   This record has been created using Conservation officer, historic buildings. Errors have been sought and corrected,but may not always be located. Such creation errors do not reflect on the standard of care.

## 2021-07-05 NOTE — Progress Notes (Deleted)
Observed one small maggot in-between 2nd and 3rd toe of right foot. Removed it and put it in trash.   His whole lower extremity was very foul smelling and continued to be throughout the morning.

## 2021-07-05 NOTE — Progress Notes (Signed)
Pharmacy Antibiotic Note  Adam Neal is a 60 y.o. male admitted on 07/04/2021 with cellulitis. Pt noted to have significant ulceration between all 5 of his toes as well as circumferentially around his ankle, maggots noted between his toes. Pharmacy has been consulted for Vancomycin dosing.  8/14 R ankle Xray: No evidence of fracture, dislocation, or joint effusion. No evidence of osteomyelitis 8/14 R ankle MRI: Cellulitis. No abscess or osteomyelitis  Plan: Adjust vancomycin dose from 750mg  IV q12h to 1250mg  IV q24h Est AUC: 491.6 Scr 0.8, Vd 0.72 L/kg, TBW for CrCl/Ke Obtain vancomycin levels around 4th or 5th dose if continued Monitor renal function and adjust dose as clinically indicated    Height: 5\' 7"  (170.2 cm) Weight: 53.8 kg (118 lb 9.7 oz) (bed weight) IBW/kg (Calculated) : 66.1  Temp (24hrs), Avg:98.3 F (36.8 C), Min:97.5 F (36.4 C), Max:99 F (37.2 C)  Recent Labs  Lab 07/04/21 1504 07/04/21 1854 07/05/21 0649  WBC 13.4*  --  10.6*  CREATININE 0.78  --  0.78  LATICACIDVEN 2.0* 2.1*  --      Estimated Creatinine Clearance: 73.8 mL/min (by C-G formula based on SCr of 0.78 mg/dL).    No Known Allergies  Antimicrobials this admission: 8/14 Zosyn >> 8/14 8/14 Vancomycin >>  Dose adjustments this admission: 8/15 Vancomycin dose from 750mg  IV q12h to 1250mg  IV q24h  Microbiology results: 8/14 BCx: in process  8/15 Aerobic culture w/ gram stain: in process  Thank you for allowing pharmacy to be a part of this patient's care.  9/15, PharmD Pharmacy Resident  07/05/2021 1:51 PM

## 2021-07-06 ENCOUNTER — Inpatient Hospital Stay: Payer: Self-pay

## 2021-07-06 LAB — COMPREHENSIVE METABOLIC PANEL
ALT: 15 U/L (ref 0–44)
AST: 36 U/L (ref 15–41)
Albumin: 2.2 g/dL — ABNORMAL LOW (ref 3.5–5.0)
Alkaline Phosphatase: 17 U/L — ABNORMAL LOW (ref 38–126)
Anion gap: 6 (ref 5–15)
BUN: 9 mg/dL (ref 8–23)
CO2: 28 mmol/L (ref 22–32)
Calcium: 8.2 mg/dL — ABNORMAL LOW (ref 8.9–10.3)
Chloride: 94 mmol/L — ABNORMAL LOW (ref 98–111)
Creatinine, Ser: 0.78 mg/dL (ref 0.61–1.24)
GFR, Estimated: >60 mL/min (ref >60)
Glucose, Bld: 94 mg/dL (ref 70–99)
Potassium: 3.3 mmol/L — ABNORMAL LOW (ref 3.5–5.1)
Sodium: 128 mmol/L — ABNORMAL LOW (ref 135–145)
Total Bilirubin: 1.1 mg/dL (ref 0.3–1.2)
Total Protein: 5.8 g/dL — ABNORMAL LOW (ref 6.5–8.1)

## 2021-07-06 LAB — PROTIME-INR
INR: 1 (ref 0.8–1.2)
Prothrombin Time: 13.6 seconds (ref 11.4–15.2)

## 2021-07-06 LAB — MAGNESIUM: Magnesium: 1.1 mg/dL — ABNORMAL LOW (ref 1.7–2.4)

## 2021-07-06 MED ORDER — MAGNESIUM SULFATE 4 GM/100ML IV SOLN
4.0000 g | Freq: Once | INTRAVENOUS | Status: AC
  Administered 2021-07-06: 13:00:00 4 g via INTRAVENOUS
  Filled 2021-07-06: qty 100

## 2021-07-06 MED ORDER — SODIUM CHLORIDE 0.9 % IV SOLN: INTRAVENOUS | Status: DC

## 2021-07-06 MED ORDER — POTASSIUM CHLORIDE CRYS ER 20 MEQ PO TBCR
40.0000 meq | EXTENDED_RELEASE_TABLET | Freq: Once | ORAL | Status: AC
  Administered 2021-07-06: 40 meq via ORAL
  Filled 2021-07-06: qty 2

## 2021-07-06 MED ORDER — ENSURE ENLIVE PO LIQD
237.0000 mL | Freq: Three times a day (TID) | ORAL | Status: DC
  Administered 2021-07-06 – 2021-07-08 (×8): 237 mL via ORAL

## 2021-07-06 MED ORDER — JUVEN PO PACK
1.0000 | PACK | Freq: Two times a day (BID) | ORAL | Status: DC
  Administered 2021-07-06 – 2021-07-08 (×6): 1 via ORAL

## 2021-07-06 NOTE — Plan of Care (Signed)
Patient will be educated on care plan and progression will improve by discharge.

## 2021-07-06 NOTE — Evaluation (Signed)
Occupational Therapy Evaluation Patient Details Name: Adam Neal MRN: [id number] DOB: [birth year] Today's Date: 07/06/2021    History of Present Illness Adam Neal is a 60 y.o. male with no reported medical history who presents with concerns of right ankle wound. Per MD note, patient has not seen a physician for many years.  States he fell about a week ago while walking up the stairs and injured his right ankle where 4 days later purulent fluid was coming from his R ankle.   Clinical Impression   Pt seen for OT evaluation this date. Upon arrival to room, pt awake and seated upright in bed eating breakfast. Pt alert and oriented to self, place, and parts of situation. Prior to admission, pt reports he was independent with ADLs and functional mobility without AD. Pt reports that family assists with IADLs. Pt endorses x1 fall within past 6 months, and attributes the fall to alcohol use. Per chart review, pt's home has bed bug infestation and no running water, however pt reporting that he is still deciding if he wants to return home vs living with mother. Pt presents with decreased safety awareness, however demonstrates baseline independence to perform ADLs and functional mobility. No additional skilled OT needs identified at this time. Upon discharge, OT to recommend intermittent supervision/assistance from family.     Follow Up Recommendations  No OT follow up    Equipment Recommendations  None recommended by OT       Precautions / Restrictions Precautions Precautions: Fall Restrictions Weight Bearing Restrictions: No      Mobility Bed Mobility Overal bed mobility: Independent                  Transfers Overall transfer level: Independent Equipment used: None                  Balance Overall balance assessment: No apparent balance deficits (not formally assessed)                                         ADL either performed or assessed  with clinical judgement   ADL Overall ADL's : Independent                                       General ADL Comments: pt able to perform standing grooming tasks and functional mobility of household distances independently      Pertinent Vitals/Pain Pain Assessment: No/denies pain        Extremity/Trunk Assessment Upper Extremity Assessment Upper Extremity Assessment: Overall WFL for tasks assessed   Lower Extremity Assessment Lower Extremity Assessment: Overall WFL for tasks assessed;RLE deficits/detail RLE Deficits / Details: RLE wound   Cervical / Trunk Assessment Cervical / Trunk Assessment: Normal   Communication Communication Communication: No difficulties   Cognition Arousal/Alertness: Awake/alert Behavior During Therapy: WFL for tasks assessed/performed Overall Cognitive Status: Within Functional Limits for tasks assessed                                 General Comments: Alert and oriented to self, place, and parts of situation. Decreased awareness of deficits   General Comments  R foot and ankle bandaged            Home  Living Family/patient expects to be discharged to:: Private residence Living Arrangements: Parent Available Help at Discharge: Family;Available PRN/intermittently;Other (Comment) (Reports his mom is 79 and unsure if she would help if he needed) Type of Home: House Home Access: Stairs to enter [employer] of Steps: Level entry at mom's house, 2 small steps with railing on L side at his house   Home Layout: One level     Bathroom Shower/Tub: Chief Strategy Officer: Standard Bathroom Accessibility: Yes   Home Equipment: None   Additional Comments: per documentation and pt may d/c to mother's house      Prior Functioning/Environment Level of Independence: Independent        Comments: Independent with ADLs and functional mobility. Family assists with IADLs        OT Problem  List: Impaired balance (sitting and/or standing)         OT Goals(Current goals can be found in the care plan section) Acute Rehab OT Goals Patient Stated Goal: to go home OT Goal Formulation: With patient Time For Goal Achievement: 07/20/21  OT Frequency:      AM-PAC OT "6 Clicks" Daily Activity     Outcome Measure Help from another person eating meals?: None Help from another person taking care of personal grooming?: None Help from another person toileting, which includes using toliet, bedpan, or urinal?: None Help from another person bathing (including washing, rinsing, drying)?: None Help from another person to put on and taking off regular upper body clothing?: None Help from another person to put on and taking off regular lower body clothing?: None 6 Click Score: 24   End of Session Nurse Communication: Mobility status  Activity Tolerance: Patient tolerated treatment well Patient left: in bed;with call bell/phone within reach  OT Visit Diagnosis: Unsteadiness on feet (R26.81);History of falling (Z91.81)                Time: 9485-4627 OT Time Calculation (min): 12 min Charges:  OT General Charges $OT Visit: 1 Visit OT Evaluation $OT Eval Moderate Complexity: 1 Mod  Matthew Folks, OTR/L [facility] [phone]

## 2021-07-06 NOTE — Progress Notes (Signed)
Initial Nutrition Assessment  DOCUMENTATION CODES:  Not applicable  INTERVENTION:  Advance diet to regular if no procedure is to be performed Ensure Enlive po TID, each supplement provides 350 kcal and 20 grams of protein MVI with minerals, folic acid, and thiamine daily. Magic cup TID with meals, each supplement provides 290 kcal and 9 grams of protein 1 packet Juven BID, each packet provides 95 calories, 2.5 grams of protein (collagen), and 9.8 grams of carbohydrate (3 grams sugar); also contains 7 grams of L-arginine and L-glutamine, 300 mg vitamin C, 15 mg vitamin E, 1.2 mcg vitamin B-12, 9.5 mg zinc, 200 mg calcium, and 1.5 g  Calcium Beta-hydroxy-Beta-methylbutyrate to support wound healing  NUTRITION DIAGNOSIS:  Increased nutrient needs related to wound healing as evidenced by estimated needs.  GOAL:  Patient will meet greater than or equal to 90% of their needs  MONITOR:  PO intake, Labs, Skin  REASON FOR ASSESSMENT:  Consult Assessment of nutrition requirement/status  ASSESSMENT:  60 y.o. male presented to ED with concerns of right ankle wound. States he fell about a week ago while walking up the stairs and injured his right ankle. Noted to have a malodorous wound wrapping around his right ankle circumferentially with maggots around his toes. Hx of EtOH and tobacco abuse.  Pt currently NPO this AM. Reviewed intake trends from prior days of admission and appetite appears to be intact. Limited weight hx available, pt borderline underweight based on weight obtained by RN staff. Will recommend supplements to promote oral intake and encourage wound healing.    Average Meal Intake: 8/14-8/16: 75% intake x 1 recorded meal  Nutritionally Relevant Medications: Scheduled Meds:  folic acid  1 mg Oral Daily   multivitamin with minerals  1 tablet Oral Daily   thiamine  100 mg Oral Daily   Continuous Infusions:  lactated ringers 50 mL/hr at 07/05/21 1944   Labs Reviewed: Na 128,  chloride 94 K 3.3  NUTRITION - FOCUSED PHYSICAL EXAM: Defer to in-person assessment  Diet Order:   Diet Order             Diet NPO time specified  Diet effective midnight                   EDUCATION NEEDS:  No education needs have been identified at this time  Skin:  Skin Assessment: Reviewed RN Assessment (malodorous wound to the right ankle)  Last BM:  8/14  Height:  Ht Readings from Last 1 Encounters:  07/04/21 5\' 7"  (1.702 m)    Weight:  Wt Readings from Last 1 Encounters:  07/04/21 53.8 kg    Ideal Body Weight:  67.3 kg  BMI:  Body mass index is 18.58 kg/m.  Estimated Nutritional Needs:  Kcal:  1700-1900kcal/d Protein:  85-100 g/d Fluid:  1.8-2L/d   07/06/21, RD, LDN Clinical Dietitian Pager on Amion

## 2021-07-06 NOTE — Progress Notes (Signed)
PROGRESS NOTE    Adam KeysWesley L Hornung  ZOX:[id number] DOB: 1961/02/17 DOA: 07/04/2021 PCP: Pcp, No   Brief Narrative: Taken from H&P. Adam Neal is a 60 y.o. male with no reported medical history who presents with concerns of right ankle wound.   Patient has not seen a physician for many years.  States he fell about a week ago while walking up the stairs and injured his right ankle.  Then about 4 days ago he noticed purulent drainage coming from his right foot.  Today he was advised by a friend to be evaluated in the ED. Patient lives with a friend and works as Materials engineermowing lawns.  Smoke & Drink about half a pint of alcohol daily for the past 40 years.  Also uses marijuana.  Patient found to have mild leukocytosis, mild lactic acidosis at 2, hyponatremia, hypokalemia and mildly elevated T bili.  He was having email address wound wrapped around the right ankle circumferentially with maggots around his toes.  Also found to have some bedbugs and lice. X-ray and MRI was negative for osteomyelitis.  Initially received IV vancomycin and Zosyn. I stopped the Zosyn and will continue with IV vancomycin based on cellulitis with purulent discharge recommendations.  No history of diabetes, A1c of 4.9. Wound and blood cultures pending-preliminary results with Gram positive cocci and Proteus Mirabellus. Patient is bed living condition and no water at home.  DSS is involved and does not think that he is safe going back home.  Social worker is trying to get hold of family, might be able to go with his mother tomorrow.  Wound care appointment was made for this Friday at an outpatient wound care center.  Subjective: Patient was seen and examined today.  No new complaints.  Eating his lunch.  Denies any pain. When asked about where to go after discharge, stating that I guess with my mom.  Assessment & Plan:   Active Problems:   Wound cellulitis   Sepsis (HCC)   Hyponatremia   Hypokalemia   Hyperglycemia    Metabolic acidosis   Anemia   Serum total bilirubin elevated   Alcohol abuse   Tobacco use   Cellulitis of right foot   Alcohol withdrawal syndrome without complication (HCC)  Sepsis secondary to right ankle cellulitis -Patient presented with mild tachycardia and leukocytosis with a right ankle circumferential wound infection.  Mild lactic acidosis which has been resolved.  Patient received IV fluid and broad-spectrum antibiotics with vancomycin and Zosyn, later transitioned to vancomycin and ceftriaxone. Wound care was obtained-appreciate their recommendations for twice daily dressings. Ordered wound culture-patient already received couple of doses of antibiotics. Blood cultures negative so far. X-ray and MRI negative for osteomyelitis. -Continue with vancomycin and ceftriaxone for now, Bactrim might be a good choice on discharge. -Follow-up with final cultures results. -Current unsafe discharge due to bed living condition, DSS is involved.  Should be able to go with his mom, TOC is trying to contact the family.  Had a wound care outpatient appointment for Friday. Also need to teach brother or mother regarding dressing changes daily.  Hyponatremia.Most likely secondary to excessive alcohol use.  Sodium at 128 today. -Give him some IV fluid -Continue to monitor  Hypokalemia and hypomagnesemia.  Very poor nutrition. -Replace electrolytes as needed and monitor  Macrocytic anemia.  Most likely secondary to alcohol use.  Borderline B12 deficiency on anemia panel.  Mildly low iron with low TIBC which makes it anemia of chronic disease. -B12 supplement.  Elevated T bili.  Liver enzymes within normal limit.  Most likely secondary to alcohol abuse, no prior diagnosis of cirrhosis. -Liver ultrasound-with hepatic steatosis.  No cirrhosis -Counseling was provided against alcohol use  Alcohol abuse.  Patient endorsed using a pint of alcohol daily for the past 40 years.  High risk for alcohol  withdrawal.  CIWA score of 0 this morning. -Continue with CIWA protocol -Continue with thiamine and folate supplement  Tobacco abuse. -Nicotine patch  Protein caloric malnutrition. Estimated body mass index is 18.58 kg/m as calculated from the following:   Height as of this encounter:  (1.702 m).   Weight as of this encounter: 53.8 kg.  -Dietitian consult  Objective: Vitals:   07/05/21 0720 07/05/21 1928 07/05/21 2340 07/06/21 1210  BP: 108/69 115/78 118/76 118/72  Pulse: 79 (!) 106 91 98  Resp: Temp: 97.8 F (36.6 C) (!) 97.5 F (36.4 C) 98.9 F (37.2 C) 97.6 F (36.4 C)  TempSrc:  Oral Oral   SpO2: 100% 99% 100% 100%  Weight:      Height:        Intake/Output Summary (Last 24 hours) at 07/06/2021 1510 Last data filed at 07/06/2021 1158 Gross per 24 hour  Intake --  Output 650 ml  Net -650 ml    Filed Weights   07/04/21 1458 07/04/21 2140  Weight: 61.2 kg 53.8 kg    Examination:  General.  Malnourished gentleman, in no acute distress. Pulmonary.  Lungs clear bilaterally, normal respiratory effort. CV.  Regular rate and rhythm, no JVD, rub or murmur. Abdomen.  Soft, nontender, nondistended, BS positive. CNS.  Alert and oriented x3.  No focal neurologic deficit. Extremities.  No edema, no cyanosis, pulses intact and symmetrical, right ankle with clean dressing. Psychiatry.  Judgment and insight appears normal.   DVT prophylaxis: Lovenox Code Status: Full Family Communication: Called brother with no response. Disposition Plan:  Status is: Inpatient  Remains inpatient appropriate because:Inpatient level of care appropriate due to severity of illness  Dispo: The patient is from: Home              Anticipated d/c is to: Home              Patient currently is medically ready.  Unsafe discharge due to bad living conditions.  Might be able to go with his mother in 1 to 2 days.   Difficult to place patient No              Level of care:  Med-Surg  All the records are reviewed and case discussed with Care Management/Social Worker. Management plans discussed with the patient, nursing and they are in agreement.  Consultants:  None  Procedures:  Antimicrobials:  Vancomycin Ceftriaxone  Data Reviewed: I have personally reviewed following labs and imaging studies  CBC: Recent Labs  Lab 07/04/21 1504 07/05/21 0649  WBC 13.4* 10.6*  NEUTROABS 8.9*  --   HGB 11.9* 10.7*  HCT 32.6* 29.2*  MCV 101.9* 102.1*  PLT 235 236    Basic Metabolic Panel: Recent Labs  Lab 07/04/21 1504 07/05/21 0649 07/06/21 0417  NA 127* 130* 128*  K 3.1* 3.9 3.3*  CL 86* 92* 94*  CO2 GLUCOSE 102* 82 94  BUN CREATININE 0.78 0.78 0.78  CALCIUM 8.8* 8.6* 8.2*  MG  --   --  1.1*    GFR: Estimated Creatinine Clearance: 73.8 mL/min (by C-G  formula based on SCr of 0.78 mg/dL). Liver Function Tests: Recent Labs  Lab 07/04/21 1504 07/05/21 0649 07/06/21 0417  AST 37 35 36  ALT 23 17 15   ALKPHOS 22* 19* 17*  BILITOT 1.6* 2.0* 1.1  PROT 8.7* 6.8 5.8*  ALBUMIN 3.5 2.7* 2.2*    No results for input(s): LIPASE, AMYLASE in the last 168 hours. No results for input(s): AMMONIA in the last 168 hours. Coagulation Profile: Recent Labs  Lab 07/06/21 0417  INR 1.0   Cardiac Enzymes: No results for input(s): CKTOTAL, CKMB, CKMBINDEX, TROPONINI in the last 168 hours. BNP (last 3 results) No results for input(s): PROBNP in the last 8760 hours. HbA1C: Recent Labs    07/04/21 2130  HGBA1C 4.9    CBG: Recent Labs  Lab 07/04/21 2216  GLUCAP 98    Lipid Profile: No results for input(s): CHOL, HDL, LDLCALC, TRIG, CHOLHDL, LDLDIRECT in the last 72 hours. Thyroid Function Tests: No results for input(s): TSH, T4TOTAL, FREET4, T3FREE, THYROIDAB in the last 72 hours. Anemia Panel: Recent Labs    07/04/21 2130  VITAMINB12 219  FOLATE 14.3  TIBC 178*  IRON 43*    Sepsis Labs: Recent Labs  Lab  07/04/21 1504 07/04/21 1854  LATICACIDVEN 2.0* 2.1*     Recent Results (from the past 240 hour(s))  SARS CORONAVIRUS 2 (TAT 6-24 HRS) Nasopharyngeal Nasopharyngeal Swab     Status: None   Collection Time: 07/04/21  6:54 PM   Specimen: Nasopharyngeal Swab  Result Value Ref Range Status   SARS Coronavirus 2 NEGATIVE NEGATIVE Final    Comment: (NOTE) SARS-CoV-2 target nucleic acids are NOT DETECTED.  The SARS-CoV-2 RNA is generally detectable in upper and lower respiratory specimens during the acute phase of infection. Negative results do not preclude SARS-CoV-2 infection, do not rule out co-infections with other pathogens, and should not be used as the sole basis for treatment or other patient management decisions. Negative results must be combined with clinical observations, patient history, and epidemiological information. The expected result is Negative.  Fact Sheet for Patients: 07/06/21  Fact Sheet for Healthcare Providers: HairSlick.no  This test is not yet approved or cleared by the quierodirigir.com FDA and  has been authorized for detection and/or diagnosis of SARS-CoV-2 by FDA under an Emergency Use Authorization (EUA). This EUA will remain  in effect (meaning this test can be used) for the duration of the COVID-19 declaration under Se ction 564(b)(1) of the Act, 21 U.S.C. section 360bbb-3(b)(1), unless the authorization is terminated or revoked sooner.  Performed at [facility], [address], [place], [place]   Culture, blood (routine x 2)     Status: None (Preliminary result)   Collection Time: 07/04/21  6:54 PM   Specimen: BLOOD  Result Value Ref Range Status   Specimen Description BLOOD BLOOD LEFT ARM  Final   Special Requests   Final    BOTTLES DRAWN AEROBIC AND ANAEROBIC Blood Culture adequate volume   Culture   Final    NO GROWTH 2 DAYS Performed at [facility],  [address] [place], [place]    Report Status PENDING  Incomplete  Culture, blood (routine x 2)     Status: None (Preliminary result)   Collection Time: 07/04/21  6:55 PM   Specimen: BLOOD  Result Value Ref Range Status   Specimen Description BLOOD BLOOD RIGHT HAND  Final   Special Requests   Final    BOTTLES DRAWN AEROBIC  AND ANAEROBIC Blood Culture results may not be optimal due to an inadequate volume of blood received in culture bottles   Culture   Final    NO GROWTH 2 DAYS Performed at [facility], [address] [place], [place] [address]    Report Status PENDING  Incomplete  Aerobic Culture w Gram Stain (superficial specimen)     Status: None (Preliminary result)   Collection Time: 07/05/21  8:30 AM   Specimen: Ankle  Result Value Ref Range Status   Specimen Description   Final    ANKLE Performed at [facility], [address], [place], [place] [address]    Special Requests   Final    NONE Performed at [facility], [address] [place], [place] [address]    Gram Stain   Final    ABUNDANT WBC PRESENT, PREDOMINANTLY PMN FEW GRAM POSITIVE COCCI RARE GRAM VARIABLE ROD    Culture   Final    RARE PROTEUS MIRABILIS SUSCEPTIBILITIES TO FOLLOW Performed at [facility], [address] [place], [place] [address]    Report Status PENDING  Incomplete      Radiology Studies: DG Ankle Complete Right  Result Date: 07/04/2021 CLINICAL DATA:  Open wound on the right ankle. EXAM: RIGHT ANKLE - COMPLETE 3+ VIEW COMPARISON:  None. FINDINGS: There is no evidence of fracture, dislocation, or joint effusion. No focal osseous demineralization to suggest osteomyelitis. A plantar calcaneal enthesophyte is noted. The bones are demineralized. There is soft tissue swelling surrounding the ankle. IMPRESSION: No evidence of osteomyelitis. Electronically Signed   By: Romona Curls M.D.   On: 07/04/2021 15:43   MR ANKLE RIGHT W WO  CONTRAST  Result Date: 07/05/2021 CLINICAL DATA:  Right ankle wound. EXAM: MRI OF THE RIGHT ANKLE WITHOUT AND WITH CONTRAST TECHNIQUE: Multiplanar, multisequence MR imaging of the ankle was performed before and after the administration of intravenous contrast. CONTRAST:  67mL GADAVIST GADOBUTROL 1 MMOL/ML IV SOLN COMPARISON:  Right ankle x-rays from same day. FINDINGS: TENDONS Peroneal: Peroneal longus tendon intact. Peroneal brevis intact. Posteromedial: Posterior tibial tendon intact. Flexor digitorum longus tendon intact. Flexor hallucis longus tendon intact. Anterior: Tibialis anterior tendon intact. Extensor hallucis longus tendon intact Extensor digitorum longus tendon intact. Achilles:  Intact. Plantar Fascia: Intact. LIGAMENTS Lateral: Anterior talofibular ligament intact. Calcaneofibular ligament intact. Posterior talofibular ligament intact. Anterior and posterior tibiofibular ligaments intact. Medial: Deltoid ligament intact. Spring ligament intact. CARTILAGE Ankle Joint: No joint effusion. Normal ankle mortise. No chondral defect. Subtalar Joints/Sinus Tarsi: Normal subtalar joints. No subtalar joint effusion. Normal sinus tarsi. Bones: No marrow signal abnormality.  No fracture or dislocation. Soft Tissue: Diffuse soft tissue swelling with mild enhancement. No fluid collection or soft tissue mass. IMPRESSION: IMPRESSION 1. Cellulitis. No abscess or osteomyelitis. Electronically Signed   By: Obie Dredge M.D.   On: 07/05/2021 05:05   US Abdomen Limited RUQ (LIVER/GB)  Result Date: 07/06/2021 CLINICAL DATA:  Abnormal liver function tests EXAM: ULTRASOUND ABDOMEN LIMITED RIGHT UPPER QUADRANT COMPARISON:  None. FINDINGS: Gallbladder: Small layering shadowing gallstones in the nondistended gallbladder, largest 6 mm. No gallbladder wall thickening. No sonographic Murphy's sign. No pericholecystic fluid. Common bile duct: Diameter: 4 mm Liver: Diffusely echogenic liver parenchyma with posterior  acoustic attenuation, compatible with diffuse hepatic steatosis. No liver masses, noting decreased sensitivity in the setting of an echogenic liver. No definite liver surface irregularity. Portal vein is patent on color Doppler imaging with normal direction of blood flow towards the  liver. Other: None. IMPRESSION: 1. Diffuse hepatic steatosis. 2. Cholelithiasis. No evidence of acute cholecystitis. No biliary ductal dilatation. Electronically Signed   By: Delbert Phenix M.D.   On: 07/06/2021 10:26    Scheduled Meds:  COVID-19 mRNA Vac-TriS (Pf[employer])  0.3 mL Intramuscular ONCE-1600   enoxaparin (LOVENOX) injection  40 mg Subcutaneous Q24H   feeding supplement  237 mL Oral TID BM   folic acid  1 mg Oral Daily   multivitamin with minerals  1 tablet Oral Daily   nicotine  14 mg Transdermal Daily   nutrition supplement (JUVEN)  1 packet Oral BID BM   silver sulfADIAZINE   Topical BID   thiamine  100 mg Oral Daily   Or   thiamine  100 mg Intravenous Daily   Continuous Infusions:  sodium chloride 125 mL/hr at 07/06/21 0957   lactated ringers 50 mL/hr at 07/05/21 1944   vancomycin Stopped (07/05/21 2130)     LOS: 2 days   Time spent: 36 minutes. More than 50% of the time was spent in counseling/coordination of care  Arnetha Courser, MD [facility]  If 7PM-7AM, please contact night-coverage [email or web address]  07/06/2021, 3:10 PM   This record has been created using Conservation officer, historic buildings. Errors have been sought and corrected,but may not always be located. Such creation errors do not reflect on the standard of care.

## 2021-07-06 NOTE — Evaluation (Addendum)
Physical Therapy Evaluation Patient Details Name: KRISTOFOR MICHALOWSKI MRN: [id number] DOB: Jul 06, 1961 Today's Date: 07/06/2021   History of Present Illness  Adam Neal is a 60 y.o. male with no reported medical history who presents with concerns of right ankle wound. Per MD note, patient has not seen a physician for many years.  States he fell about a week ago while walking up the stairs and injured his right ankle where 4 days later purulent fluid was coming from his R ankle.   Clinical Impression  Pt admitted with above diagnosis. Pt able to appropriately report PLOF, home, any use of AD. Per pt he is fully independent with no activity limitations. Pt mod-I with all bed mobility and able to stand and ambulate with supervision and use of IV pole with no LOB, WFL gait and step through pattern. Slow and cautious gait due to equipment in room and use of IV pole. Pt denies any pain with weightbearing on R foot during functional mobility. Limited to ambulating tin room due to current bed bugs. Pt appears to be at baseline function. Pt educated on safe mobility with IV pole in room until IV removed with pt demo and verbalizing understanding. PT to sign off on current orders. Please consult if any change in status warrants PT care.     Follow Up Recommendations No PT follow up    Equipment Recommendations  None recommended by PT    Recommendations for Other Services       Precautions / Restrictions Precautions Precautions: None Restrictions Weight Bearing Restrictions: No      Mobility  Bed Mobility Overal bed mobility: Independent                  Transfers Overall transfer level: Independent Equipment used: None                Ambulation/Gait Ambulation/Gait assistance: Supervision Gait Distance (Feet): 20 Feet Assistive device: IV Pole Gait Pattern/deviations: WFL(Within Functional Limits)     General Gait Details: slow/cautious pace due to Iv pole and  equipment in room. Pt displayed safe ambulation manipulating Iv pole within room.  Stairs            Wheelchair Mobility    Modified Rankin (Stroke Patients Only)       Balance Overall balance assessment: No apparent balance deficits (not formally assessed)                                           Pertinent Vitals/Pain Pain Assessment: No/denies pain    Home Living Family/patient expects to be discharged to:: Private residence Living Arrangements: Parent Available Help at Discharge: Family;Available PRN/intermittently;Other (Comment) (Reports his mom is 60 and unsure if she would help if he needed) Type of Home: House Home Access: Stairs to enter   [employer] of Steps: Level entry at [facility], 2 small steps with railing on L side at his house Home Layout: One level Home Equipment: None Additional Comments: per documentation and pt, hopeful to d/c to mother's house    Prior Function Level of Independence: Independent               Hand Dominance        Extremity/Trunk Assessment   Upper Extremity Assessment Upper Extremity Assessment: Overall WFL for tasks assessed    Lower Extremity Assessment Lower Extremity Assessment: Overall [facility]  for tasks assessed;RLE deficits/detail RLE Deficits / Details: RLE wound    Cervical / Trunk Assessment Cervical / Trunk Assessment: Normal  Communication   Communication: No difficulties  Cognition Arousal/Alertness: Awake/alert Behavior During Therapy: WFL for tasks assessed/performed Overall Cognitive Status: Within Functional Limits for tasks assessed                                 General Comments: Answered all questions appropriately, followed 1-2 step commands with no difficulty      General Comments General comments (skin integrity, edema, etc.): R foot and ankle bandaged    Exercises     Assessment/Plan    PT Assessment Patent does not need any further  PT services  PT Problem List         PT Treatment Interventions      PT Goals (Current goals can be found in the Care Plan section)  Acute Rehab PT Goals Patient Stated Goal: to go home/ to mother's home PT Goal Formulation: With patient Time For Goal Achievement: 07/20/21 Potential to Achieve Goals: Good    Frequency     Barriers to discharge        Co-evaluation               AM-PAC PT "6 Clicks" Mobility  Outcome Measure Help needed turning from your back to your side while in a flat bed without using bedrails?: None Help needed moving from lying on your back to sitting on the side of a flat bed without using bedrails?: None Help needed moving to and from a bed to a chair (including a wheelchair)?: None Help needed standing up from a chair using your arms (e.g., wheelchair or bedside chair)?: None Help needed to walk in hospital room?: None Help needed climbing 3-5 steps with a railing? : A Little 6 Click Score: 23    End of Session Equipment Utilized During Treatment: Gait belt Activity Tolerance: Patient tolerated treatment well;No increased pain Patient left: in bed;with nursing/sitter in room;with call bell/phone within reach Nurse Communication: Mobility status PT Visit Diagnosis: Other abnormalities of gait and mobility (R26.89)    Time: 8841-6606 PT Time Calculation (min) (ACUTE ONLY): 22 min   Charges:   PT Evaluation $PT Eval Low Complexity: 1 Low PT Treatments $Gait Training: 8-22 mins        Delphia Grates. Fairly IV, PT, DPT Physical Therapist- [place]  [facility]  07/06/2021, 10:47 AM

## 2021-07-06 NOTE — TOC Transition Note (Signed)
Transition of Care [facility]) - CM/SW Discharge Note   Patient Details  Name: Adam Neal MRN: [id number] Date of Birth: 09-Nov-1961  [facility] CM/SW Contact:  Shelbie Hutching, RN Phone Number: 07/06/2021, 3:28 PM   Clinical Narrative:     RNCM was able to get patient scheduled over at the [facility] for this Friday 8/19 at 0830.  Patient's brother Adam Neal reports that they will get the patient to his appointment.  MD would like to plan for discharge tomorrow.  Wound cultures are still pending.  Adam Neal will be here tomorrow afternoon.    DSS did come to assess the patient this afternoon.  SW [place] met with patient and will go to assess his home this afternoon.   Brother Adam Neal reports that the patient will go to friends home at discharge and will not be going back home.     Final next level of care: Home/Self Care Barriers to Discharge: Barriers Resolved   Patient Goals and CMS Choice        Discharge Placement                       Discharge Plan and Services   Discharge Planning Services: CM Consult, [facility], Follow-up appt scheduled            DME Arranged: N/A DME Agency: NA                  Social Determinants of Health (SDOH) Interventions     Readmission Risk Interventions No flowsheet data found.

## 2021-07-06 NOTE — Progress Notes (Signed)
Late Entry 07/04/21 2300 Patient arrived via stretcher to room 101. Admitting diagnosis right lower leg cellulitis. Patient denied any pain or discomfort just wanted to be left alone to sleep. Wound had been cleaned and dressed in ER. 0500 right leg wound dressing drainage yellow serous through dressing, dressing changed, wound area cleaned with normal saline clean dressing applied. Patient tolerated dressing change well and denies any pain or discomfort.

## 2021-07-07 LAB — AEROBIC CULTURE W GRAM STAIN (SUPERFICIAL SPECIMEN)

## 2021-07-07 LAB — BASIC METABOLIC PANEL
Anion gap: 6 (ref 5–15)
BUN: <5 mg/dL — ABNORMAL LOW (ref 8–23)
CO2: 26 mmol/L (ref 22–32)
Calcium: 8.4 mg/dL — ABNORMAL LOW (ref 8.9–10.3)
Chloride: 96 mmol/L — ABNORMAL LOW (ref 98–111)
Creatinine, Ser: 0.57 mg/dL — ABNORMAL LOW (ref 0.61–1.24)
GFR, Estimated: >60 mL/min (ref >60)
Glucose, Bld: 97 mg/dL (ref 70–99)
Potassium: 3.7 mmol/L (ref 3.5–5.1)
Sodium: 128 mmol/L — ABNORMAL LOW (ref 135–145)

## 2021-07-07 LAB — MAGNESIUM: Magnesium: 1.5 mg/dL — ABNORMAL LOW (ref 1.7–2.4)

## 2021-07-07 LAB — VANCOMYCIN, PEAK: Vancomycin Pk: 35 ug/mL (ref 30–40)

## 2021-07-07 MED ORDER — MAGNESIUM SULFATE 4 GM/100ML IV SOLN
4.0000 g | Freq: Once | INTRAVENOUS | Status: AC
  Administered 2021-07-07: 10:00:00 4 g via INTRAVENOUS
  Filled 2021-07-07: qty 100

## 2021-07-07 MED ORDER — SODIUM CHLORIDE 0.9 % IV SOLN
1.0000 g | INTRAVENOUS | Status: DC
  Administered 2021-07-07 – 2021-07-08 (×2): 1 g via INTRAVENOUS
  Filled 2021-07-07: qty 10
  Filled 2021-07-07: qty 1
  Filled 2021-07-07: qty 10

## 2021-07-07 NOTE — Progress Notes (Addendum)
Pharmacy Antibiotic Note  Adam Neal is a 60 y.o. male admitted on 07/04/2021 with cellulitis. Pt noted to have significant ulceration between all 5 of his toes as well as circumferentially around his ankle, maggots noted between his toes. Pharmacy has been consulted for Vancomycin dosing.  8/14 R ankle Xray: No evidence of fracture, dislocation, or joint effusion. No evidence of osteomyelitis 8/14 R ankle MRI: Cellulitis. No abscess or osteomyelitis  8/17 ankle culture growing proteus (sensitive to ceftriaxone) and few gram positive cocci   Plan: Continue ceftriaxone 1g daily (D1 ABX) Pt received a loading dose of vancomycin 1250 mg x 1 on 8/14 Continue vancomycin 1250mg  q24h (D4 ABX) Est AUC: 491.6 Scr 0.8, Vd 0.72 L/kg, TBW for CrCl/Ke Obtain vancomycin peak at 2230 8/17 and trough at 1930 8/18 Monitor renal function and adjust dose as clinically indicated  At discharge, the current plan is to transition to bactrim    Height: 5\' 7"  (170.2 cm) Weight: 53.8 kg (118 lb 9.7 oz) (bed weight) IBW/kg (Calculated) : 66.1  Temp (24hrs), Avg:98.1 F (36.7 C), Min:98 F (36.7 C), Max:98.3 F (36.8 C)  Recent Labs  Lab 07/04/21 1504 07/04/21 1854 07/05/21 0649 07/06/21 0417 07/07/21 0538  WBC 13.4*  --  10.6*  --   --   CREATININE 0.78  --  0.78 0.78 0.57*  LATICACIDVEN 2.0* 2.1*  --   --   --      Estimated Creatinine Clearance: 73.8 mL/min (A) (by C-G formula based on SCr of 0.57 mg/dL (L)).    No Known Allergies  Antimicrobials this admission: 8/14 Zosyn >> 8/14 8/14 Vancomycin >>  Dose adjustments this admission: 8/15 Vancomycin dose from 750mg  IV q12h to 1250mg  IV q24h  Microbiology results: 8/14 BCx: NG x3 days 8/15 Aerobic culture w/ gram stain: few gram positive cocci, rare gram variable rod, rare proteus mirabilis   Thank you for allowing pharmacy to be a part of this patient's care.  , PharmD Pharmacy Resident  07/07/2021 12:22  PM

## 2021-07-07 NOTE — TOC Progression Note (Signed)
Transition of Care [facility]) - Progression Note    Patient Details  Name: Adam Neal MRN: [id number] Date of Birth: 1961-02-04  Transition of Care [facility]) CM/SW Contact  Allayne Butcher, RN Phone Number: 07/07/2021, 1:23 PM  Clinical Narrative:    Wound culture report is still pending, MD anticipates discharge tomorrow.  Patient's brother will bring him clothes and provide transportation home for him.  [name] has the patient a place to stay for a few days but no permanent housing solution.   DSS SW [place] following, RNCM left her a message for a return call.   Expected Discharge Plan: Home/Self Care Barriers to Discharge: Continued Medical Work up  Expected Discharge Plan and Services Expected Discharge Plan: Home/Self Care   Discharge Planning Services: CM Consult, [facility], Follow-up appt scheduled   Living arrangements for the past 2 months: Single Family Home                 DME Arranged: N/A DME Agency: NA                   Social Determinants of Health (SDOH) Interventions    Readmission Risk Interventions No flowsheet data found.

## 2021-07-07 NOTE — Progress Notes (Addendum)
Progress Note    Adam Neal   PPI:[id number]  DOB: 05/25/1961  DOA: 07/04/2021     3  PCP: Pcp, No  Initial CC: right ankle wound  Hospital Course: Adam Neal is a 60 y.o. male with no reported medical history who presents with concerns of right ankle wound.   Patient has not seen a physician for many years.  States he fell about a week ago while walking up the stairs and injured his right ankle.  Then about 4 days ago he noticed purulent drainage coming from his right foot.  He was advised by a friend to be evaluated in the ED. 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  Smoke & Drink about half a pint of alcohol daily for the past 40 years.  Also uses marijuana.   Patient found to have mild leukocytosis, mild lactic acidosis at 2, hyponatremia, hypokalemia and mildly elevated T bili.  Also found to have some bedbugs and lice. X-ray and MRI was negative for osteomyelitis.  Initially received IV vancomycin and Zosyn. Wound and blood cultures pending-preliminary results with Gram positive cocci and Proteus Mirabellus. Patient has a bad living condition and no water at home.  DSS is involved and does not think that he is safe going back home.  Interval History:  No events overnight.  Patient seen resting in bed comfortably this morning.  Endorses that he does have sensation in his lower extremities and denies any pain with palpation of his right ankle.  ROS: Constitutional: negative for chills, fatigue, and fevers, Respiratory: negative for cough, Cardiovascular: negative for chest pain, and Gastrointestinal: negative for abdominal pain  Assessment & Plan: Sepsis secondary to right ankle cellulitis -Patient presented with mild tachycardia and leukocytosis with a right ankle circumferential wound infection.  Mild lactic acidosis which has been resolved.  Patient received IV fluid and broad-spectrum antibiotics with vancomycin and Zosyn, later transitioned to  vancomycin and ceftriaxone. Wound care was obtained-appreciate their recommendations for twice daily dressings. Ordered wound culture-patient already received couple of doses of antibiotics. Blood cultures negative so far. X-ray and MRI negative for osteomyelitis. -Continue with vancomycin and ceftriaxone for now, Bactrim might be a good choice on discharge. -Follow-up with final cultures results. -Current unsafe discharge due to bed living condition, DSS is involved.  Tentative plan is home with family possibly on Thursday   Hyponatremia.Most likely secondary to excessive alcohol use.  - stable    Hypokalemia and hypomagnesemia.  Very poor nutrition. -Replace electrolytes as needed and monitor   Macrocytic anemia.  Most likely secondary to alcohol use.  Borderline B12 deficiency on anemia panel.  Mildly low iron with low TIBC which makes it anemia of chronic disease. -B12 supplement.   Elevated T bili.  Liver enzymes within normal limit.  Most likely secondary to alcohol abuse, no prior diagnosis of cirrhosis. -Liver ultrasound-with hepatic steatosis.  No cirrhosis -Counseling was provided against alcohol use   Alcohol abuse.  Patient endorsed using a pint of alcohol daily for the past 40 years.   -Continue with CIWA protocol; if no symptoms by tomorrow, can discontinue protocol -Continue with thiamine and folate supplement   Tobacco abuse. -Nicotine patch   Protein caloric malnutrition. Estimated body mass index is 18.58 kg/m as calculated from the following:   Height as of this encounter: 5\' 7"  (1.702 m).   Weight as of this encounter: 53.8 kg.  -Dietitian consult  Old records reviewed in assessment of this patient  Antimicrobials:   DVT prophylaxis: enoxaparin (LOVENOX) injection 40 mg Start: 07/05/21 2200 SCDs Start: 07/04/21 1841   Code Status:   Code Status: Full Code Family Communication:   Disposition Plan: Status is: Inpatient  Remains inpatient  appropriate because:Unsafe d/c plan, IV treatments appropriate due to intensity of illness or inability to take PO, and Inpatient level of care appropriate due to severity of illness  Dispo: The patient is from: Home              Anticipated d/c is to:  to family's house, tentative 8/18              Patient currently is not medically stable to d/c.   Difficult to place patient Yes      Risk of unplanned readmission score: Unplanned Admission- Pilot do not use: 10.92   Objective: Blood pressure 125/83, pulse 85, temperature 98 F (36.7 C), temperature source Oral, resp. rate 18, height 5\' 7"  (1.702 m), weight 53.8 kg, SpO2 98 %.  Examination: General appearance: alert, cooperative, and no distress Head: Normocephalic, without obvious abnormality, atraumatic Eyes:  EOMI Lungs: clear to auscultation bilaterally Heart: regular rate and rhythm and S1, S2 normal Abdomen: normal findings: bowel sounds normal and soft, non-tender Extremities:  No edema.  See picture below.  Right lower extremity noted with excoriated skin and surrounding erythema Skin:  warm, dry Neurologic: Grossly normal    Consultants:    Procedures:    Data Reviewed: I have personally reviewed following labs and imaging studies Results for orders placed or performed during the hospital encounter of 07/04/21 (from the past 24 hour(s))  Basic metabolic panel     Status: Abnormal   Collection Time: 07/07/21  5:38 AM  Result Value Ref Range   Sodium 128 (L) 135 - 145 mmol/L   Potassium 3.7 3.5 - 5.1 mmol/L   Chloride 96 (L) 98 - 111 mmol/L   CO2 26 22 - 32 mmol/L   Glucose, Bld 97 70 - 99 mg/dL   BUN <5 (L) 8 - 23 mg/dL   Creatinine, Ser 7.820.57 (L) 0.61 - 1.24 mg/dL   Calcium 8.4 (L) 8.9 - 10.3 mg/dL   GFR, Estimated >95>60 >62>60 mL/min   Anion gap 6 5 - 15  Magnesium     Status: Abnormal   Collection Time: 07/07/21  5:38 AM  Result Value Ref Range   Magnesium 1.5 (L) 1.7 - 2.4 mg/dL    Recent Results (from  the past 240 hour(s))  SARS CORONAVIRUS 2 (TAT 6-24 HRS) Nasopharyngeal Nasopharyngeal Swab     Status: None   Collection Time: 07/04/21  6:54 PM   Specimen: Nasopharyngeal Swab  Result Value Ref Range Status   SARS Coronavirus 2 NEGATIVE NEGATIVE Final    Comment: (NOTE) SARS-CoV-2 target nucleic acids are NOT DETECTED.  The SARS-CoV-2 RNA is generally detectable in upper and lower respiratory specimens during the acute phase of infection. Negative results do not preclude SARS-CoV-2 infection, do not rule out co-infections with other pathogens, and should not be used as the sole basis for treatment or other patient management decisions. Negative results must be combined with clinical observations, patient history, and epidemiological information. The expected result is Negative.  Fact Sheet for Patients: HairSlick.nohttps://www.fda.gov/media/138098/download  Fact Sheet for Healthcare Providers: quierodirigir.comhttps://www.fda.gov/media/138095/download  This test is not yet approved or cleared by the [place] FDA and  has been authorized for detection and/or diagnosis of SARS-CoV-2 by FDA under an Emergency Use Authorization (EUA). This EUA  will remain  in effect (meaning this test can be used) for the duration of the COVID-19 declaration under Se ction 564(b)(1) of the Act, 21 U.S.C. section 360bbb-3(b)(1), unless the authorization is terminated or revoked sooner.  Performed at [facility], [address], [place], [place] [address]   Culture, blood (routine x 2)     Status: None (Preliminary result)   Collection Time: 07/04/21  6:54 PM   Specimen: BLOOD  Result Value Ref Range Status   Specimen Description BLOOD BLOOD LEFT ARM  Final   Special Requests   Final    BOTTLES DRAWN AEROBIC AND ANAEROBIC Blood Culture adequate volume   Culture   Final    NO GROWTH 3 DAYS Performed at [facility], [address], [place], [place] [address]    Report Status PENDING  Incomplete   Culture, blood (routine x 2)     Status: None (Preliminary result)   Collection Time: 07/04/21  6:55 PM   Specimen: BLOOD  Result Value Ref Range Status   Specimen Description BLOOD BLOOD RIGHT HAND  Final   Special Requests   Final    BOTTLES DRAWN AEROBIC AND ANAEROBIC Blood Culture results may not be optimal due to an inadequate volume of blood received in culture bottles   Culture   Final    NO GROWTH 3 DAYS Performed at [facility], [address] [place], [place] [address]    Report Status PENDING  Incomplete  Aerobic Culture w Gram Stain (superficial specimen)     Status: None   Collection Time: 07/05/21  8:30 AM   Specimen: Ankle  Result Value Ref Range Status   Specimen Description   Final    ANKLE Performed at [facility], [address] [place], [place] [address]    Special Requests   Final    NONE Performed at [facility], [address] [place], [place] [address]    Gram Stain   Final    ABUNDANT WBC PRESENT, PREDOMINANTLY PMN FEW GRAM POSITIVE COCCI RARE GRAM VARIABLE ROD Performed at [facility], [address] [place], [place] [address]    Culture RARE PROTEUS MIRABILIS  Final   Report Status 07/07/2021 FINAL  Final   Organism ID, Bacteria PROTEUS MIRABILIS  Final      Susceptibility   Proteus mirabilis - MIC*    AMPICILLIN <=2 SENSITIVE Sensitive     CEFAZOLIN <=4 SENSITIVE Sensitive     CEFEPIME <=0.12 SENSITIVE Sensitive     CEFTAZIDIME <=1 SENSITIVE Sensitive     CEFTRIAXONE <=0.25 SENSITIVE Sensitive     CIPROFLOXACIN <=0.25 SENSITIVE Sensitive     GENTAMICIN <=1 SENSITIVE Sensitive     IMIPENEM 2 SENSITIVE Sensitive     TRIMETH/SULFA <=20 SENSITIVE Sensitive     AMPICILLIN/SULBACTAM <=2 SENSITIVE Sensitive     PIP/TAZO <=4 SENSITIVE Sensitive     * RARE PROTEUS MIRABILIS     Radiology Studies: US Abdomen Limited RUQ (LIVER/GB)  Result Date: 07/06/2021 CLINICAL DATA:  Abnormal liver function tests  EXAM: ULTRASOUND ABDOMEN LIMITED RIGHT UPPER QUADRANT COMPARISON:  None. FINDINGS: Gallbladder: Small layering shadowing gallstones in the nondistended gallbladder, largest 6 mm. No gallbladder wall thickening. No sonographic Murphy's sign. No pericholecystic fluid. Common bile duct: Diameter: 4 mm Liver: Diffusely echogenic liver parenchyma with posterior acoustic attenuation, compatible with diffuse hepatic steatosis. No liver masses, noting decreased sensitivity in the setting of an echogenic liver. No definite liver surface irregularity. Portal  vein is patent on color Doppler imaging with normal direction of blood flow towards the liver. Other: None. IMPRESSION: 1. Diffuse hepatic steatosis. 2. Cholelithiasis. No evidence of acute cholecystitis. No biliary ductal dilatation. Electronically Signed   By: Delbert Phenix M.D.   On: 07/06/2021 10:26   US Abdomen Limited RUQ (LIVER/GB)  Final Result    MR ANKLE RIGHT W WO CONTRAST  Final Result    DG Ankle Complete Right  Final Result      Scheduled Meds:  COVID-19 mRNA Vac-TriS (Pf[employer])  0.3 mL Intramuscular ONCE-1600   enoxaparin (LOVENOX) injection  40 mg Subcutaneous Q24H   feeding supplement  237 mL Oral TID BM   folic acid  1 mg Oral Daily   multivitamin with minerals  1 tablet Oral Daily   nicotine  14 mg Transdermal Daily   nutrition supplement (JUVEN)  1 packet Oral BID BM   silver sulfADIAZINE   Topical BID   thiamine  100 mg Oral Daily   Or   thiamine  100 mg Intravenous Daily   PRN Meds: LORazepam **OR** LORazepam Continuous Infusions:  cefTRIAXone (ROCEPHIN)  IV 1 g (07/07/21 0811)   vancomycin 1,250 mg (07/06/21 2024)     LOS: 3 days  Time spent: Greater than 50% of the 35 minute visit was spent in counseling/coordination of care for the patient as laid out in the A&P.   Lewie Chamber, MD [facility] 07/07/2021, 1:06 PM

## 2021-07-08 ENCOUNTER — Other Ambulatory Visit: Payer: Self-pay

## 2021-07-08 LAB — CBC WITH DIFFERENTIAL/PLATELET
Abs Immature Granulocytes: 0.25 10*3/uL — ABNORMAL HIGH (ref 0.00–0.07)
Basophils Absolute: 0.1 10*3/uL (ref 0.0–0.1)
Basophils Relative: 1 %
Eosinophils Absolute: 0.2 10*3/uL (ref 0.0–0.5)
Eosinophils Relative: 3 %
HCT: 25.9 % — ABNORMAL LOW (ref 39.0–52.0)
Hemoglobin: 9.3 g/dL — ABNORMAL LOW (ref 13.0–17.0)
Immature Granulocytes: 3 %
Lymphocytes Relative: 33 %
Lymphs Abs: 2.7 10*3/uL (ref 0.7–4.0)
MCH: 36.2 pg — ABNORMAL HIGH (ref 26.0–34.0)
MCHC: 35.9 g/dL (ref 30.0–36.0)
MCV: 100.8 fL — ABNORMAL HIGH (ref 80.0–100.0)
Monocytes Absolute: 1.3 10*3/uL — ABNORMAL HIGH (ref 0.1–1.0)
Monocytes Relative: 16 %
Neutro Abs: 3.4 10*3/uL (ref 1.7–7.7)
Neutrophils Relative %: 44 %
Platelets: 373 10*3/uL (ref 150–400)
RBC: 2.57 MIL/uL — ABNORMAL LOW (ref 4.22–5.81)
RDW: 12.8 % (ref 11.5–15.5)
Smear Review: NORMAL
WBC: 8 10*3/uL (ref 4.0–10.5)
nRBC: 0 % (ref 0.0–0.2)

## 2021-07-08 LAB — BASIC METABOLIC PANEL
Anion gap: 7 (ref 5–15)
BUN: 7 mg/dL — ABNORMAL LOW (ref 8–23)
CO2: 26 mmol/L (ref 22–32)
Calcium: 8.5 mg/dL — ABNORMAL LOW (ref 8.9–10.3)
Chloride: 96 mmol/L — ABNORMAL LOW (ref 98–111)
Creatinine, Ser: 0.67 mg/dL (ref 0.61–1.24)
GFR, Estimated: >60 mL/min (ref >60)
Glucose, Bld: 94 mg/dL (ref 70–99)
Potassium: 3.5 mmol/L (ref 3.5–5.1)
Sodium: 129 mmol/L — ABNORMAL LOW (ref 135–145)

## 2021-07-08 LAB — MAGNESIUM: Magnesium: 1.4 mg/dL — ABNORMAL LOW (ref 1.7–2.4)

## 2021-07-08 MED ORDER — SILVER SULFADIAZINE 1 % EX CREA
TOPICAL_CREAM | Freq: Two times a day (BID) | CUTANEOUS | 1 refills | Status: AC
  Filled 2021-07-08 – 2021-07-09 (×2): qty 50, 25d supply, fill #0

## 2021-07-08 MED ORDER — MAGNESIUM SULFATE 4 GM/100ML IV SOLN
4.0000 g | Freq: Once | INTRAVENOUS | Status: AC
  Administered 2021-07-08: 08:00:00 4 g via INTRAVENOUS
  Filled 2021-07-08: qty 100

## 2021-07-08 MED ORDER — CEFDINIR 300 MG PO CAPS
300.0000 mg | ORAL_CAPSULE | Freq: Two times a day (BID) | ORAL | 0 refills | Status: AC
  Filled 2021-07-08: qty 10, 5d supply, fill #0

## 2021-07-08 NOTE — Progress Notes (Signed)
Received call from patient's brother [name] who stated it would now be between 6:30-7pm when he arrived to pick up patient.

## 2021-07-08 NOTE — Discharge Summary (Signed)
Physician Discharge Summary   Adam Neal:[id number] DOB: 1960-12-11 DOA: 07/04/2021  PCP: Oneita Hurt, No  Admit date: 07/04/2021 Discharge date:  07/08/2021  Admitted From: home Disposition:  home Discharging physician: Lewie Chamber, MD  Recommendations for Outpatient Follow-up:  Concern about patient's home living situation; APS referral made during hospitalization  Follow-up right ankle wound.  Patient referred to wound care center  Home Health:  Equipment/Devices:   Patient discharged to home in Discharge Condition: stable Risk of unplanned readmission score: Unplanned Admission- Pilot do not use: 10.88  CODE STATUS: Full Diet recommendation:  Diet Orders (From admission, onward)     Start     Ordered   07/08/21 0000  Diet - low sodium heart healthy        07/08/21 1230   07/06/21 0951  Diet Heart Room service appropriate? Yes; Fluid consistency: Thin  Diet effective now       Question Answer Comment  Room service appropriate? Yes   Fluid consistency: Thin      07/06/21 0950            Hospital Course:  Adam Neal is a 60 y.o. male with no reported medical history who presented with concerns of right ankle wound.   Patient has not seen a physician for many years.  States he fell about a week ago while walking up the stairs and injured his right ankle.  Then about 4 days ago he noticed purulent drainage coming from his right foot.  He was advised by a friend to be evaluated in the ED. 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  Smoke & Drink about half a pint of alcohol daily for the past 40 years.  Also uses marijuana.   Patient found to have mild leukocytosis, mild lactic acidosis at 2, hyponatremia, hypokalemia and mildly elevated T bili.  Also found to have some bedbugs and lice. X-ray and MRI was negative for osteomyelitis.  Initially received IV vancomycin and Zosyn. Wound and blood cultures pending-preliminary results with Gram positive  cocci and Proteus Mirabellus. Patient has a bad living condition and no water at home.  DSS is involved and does not think that he is safe going back home.  Sepsis secondary to right ankle cellulitis -Patient presented with mild tachycardia and leukocytosis with a right ankle circumferential wound infection.  Mild lactic acidosis which has been resolved.  Patient received IV fluid and broad-spectrum antibiotics with vancomycin and Zosyn, later transitioned to vancomycin and ceftriaxone. Wound care was obtained-appreciate their recommendations for twice daily dressings. Ordered wound culture-patient already received couple of doses of antibiotics. Blood cultures negative so far. X-ray and MRI negative for osteomyelitis. Completed 4 days vancomycin and 2 days rocephin in hospital.  Final culture from wound reviewed and noted with Proteus as well as few GPC and rare gram variable rod.  He was discharged with Omnicef to complete further antibiotic course at time of discharge.  This was provided by medication management for assistance -Ongoing wound care to continue at discharge and Silvadene cream also to be provided   Hyponatremia.Most likely secondary to excessive alcohol use.  - stable    Hypokalemia and hypomagnesemia.  Very poor nutrition. -Replace electrolytes as needed and monitor   Macrocytic anemia.  Most likely secondary to alcohol use.  Borderline B12 deficiency on anemia panel.  Mildly low iron with low TIBC which makes it anemia of chronic disease. -B12 supplement.   Elevated T bili.  Liver enzymes  within normal limit.  Most likely secondary to alcohol abuse, no prior diagnosis of cirrhosis. -Liver ultrasound-with hepatic steatosis.  No cirrhosis -Counseling was provided against alcohol use   Alcohol abuse.  Patient endorsed using a pint of alcohol daily for the past 40 years.   -Did not develop any withdrawal symptoms during hospitalization   Tobacco abuse. -Nicotine patch    Protein caloric malnutrition. Estimated body mass index is 18.58 kg/m as calculated from the following:   Height as of this encounter: 5\' 7"  (1.702 m).   Weight as of this encounter: 53.8 kg.    The patient's chronic medical conditions were treated accordingly per the patient's home medication regimen except as noted.  On day of discharge, patient was felt deemed stable for discharge. Patient/family member advised to call PCP or come back to ER if needed.   Principal Diagnosis: Cellulitis of right foot  Discharge Diagnoses: Active Hospital Problems   Diagnosis Date Noted   Cellulitis of right foot    Alcohol withdrawal syndrome without complication (HCC)    Wound cellulitis 07/04/2021   Sepsis (HCC) 07/04/2021   Hyponatremia 07/04/2021   Hypokalemia 07/04/2021   Hyperglycemia 07/04/2021   Metabolic acidosis 07/04/2021   Anemia 07/04/2021   Serum total bilirubin elevated 07/04/2021   Alcohol abuse 07/04/2021   Tobacco use 07/04/2021    Resolved Hospital Problems  No resolved problems to display.    Discharge Instructions     Diet - low sodium heart healthy   Complete by: As directed    Discharge wound care:   Complete by: As directed    Clean right lower extremity with soap and water then pat dry.  Apply Silvadene cream and cover with dry gauze then wrap with Kerlix bandage.  Change twice daily.   Increase activity slowly   Complete by: As directed       Allergies as of 07/08/2021   No Known Allergies      Medication List     TAKE these medications    cefdinir 300 MG capsule Commonly known as: OMNICEF Take 1 capsule (300 mg total) by mouth 2 (two) times daily for 5 days.   silver sulfADIAZINE 1 % cream Commonly known as: SILVADENE Apply one application topically to the affected area(s) 2 (two) times daily. Cover with dry gauze and wrap with Kerlix bandage               Discharge Care Instructions  (From admission, onward)           Start      Ordered   07/08/21 0000  Discharge wound care:       Comments: Clean right lower extremity with soap and water then pat dry.  Apply Silvadene cream and cover with dry gauze then wrap with Kerlix bandage.  Change twice daily.   07/08/21 1230            Follow-up Information     [facility]. Call in 1 week(s).   Specialty: Primary Care Why: Call to get set up with primary care services  Office closed patient to call and make own follow up appt Contact information: [address] [place] [phone]        [facility]. Go on 07/09/2021.   Specialty: Wound Care Why: Wound Center appointment at 8:30 am on Friday 8/19.   If you cannot make your appointment call to reschedule as soon as  possible. Contact information: [address] [phone] [place] [phone]               No Known Allergies  Consultations:   Discharge Exam: BP 127/69 (BP Location: Left Arm)   Pulse 98   Temp 98 F (36.7 C)   Resp 14   Ht 5\' 7"  (1.702 m)   Wt 53.8 kg Comment: bed weight  SpO2 100%   BMI 18.58 kg/m  General appearance: alert, cooperative, and no distress Head: Normocephalic, without obvious abnormality, atraumatic Eyes:  EOMI Lungs: clear to auscultation bilaterally Heart: regular rate and rhythm and S1, S2 normal Abdomen: normal findings: bowel sounds normal and soft, non-tender Extremities:  No edema.  Right lower extremity noted with excoriated skin and surrounding erythema Skin:  warm, dry Neurologic: Grossly normal  The results of significant diagnostics from this hospitalization (including imaging, microbiology, ancillary and laboratory) are listed below for reference.   Microbiology: Recent Results (from the past 240 hour(s))  SARS CORONAVIRUS 2 (TAT 6-24 HRS) Nasopharyngeal Nasopharyngeal Swab     Status: None   Collection Time: 07/04/21  6:54 PM   Specimen:  Nasopharyngeal Swab  Result Value Ref Range Status   SARS Coronavirus 2 NEGATIVE NEGATIVE Final    Comment: (NOTE) SARS-CoV-2 target nucleic acids are NOT DETECTED.  The SARS-CoV-2 RNA is generally detectable in upper and lower respiratory specimens during the acute phase of infection. Negative results do not preclude SARS-CoV-2 infection, do not rule out co-infections with other pathogens, and should not be used as the sole basis for treatment or other patient management decisions. Negative results must be combined with clinical observations, patient history, and epidemiological information. The expected result is Negative.  Fact Sheet for Patients: 07/06/21  Fact Sheet for Healthcare Providers: HairSlick.no  This test is not yet approved or cleared by the [place] FDA and  has been authorized for detection and/or diagnosis of SARS-CoV-2 by FDA under an Emergency Use Authorization (EUA). This EUA will remain  in effect (meaning this test can be used) for the duration of the COVID-19 declaration under Se ction 564(b)(1) of the Act, 21 U.S.C. section 360bbb-3(b)(1), unless the authorization is terminated or revoked sooner.  Performed at [facility], [address] [place], [place]   Culture, blood (routine x 2)     Status: None (Preliminary result)   Collection Time: 07/04/21  6:54 PM   Specimen: BLOOD  Result Value Ref Range Status   Specimen Description BLOOD BLOOD LEFT ARM  Final   Special Requests   Final    BOTTLES DRAWN AEROBIC AND ANAEROBIC Blood Culture adequate volume   Culture   Final    NO GROWTH 4 DAYS Performed at [facility], [address] [place], [place]    Report Status PENDING  Incomplete  Culture, blood (routine x 2)     Status: None (Preliminary result)   Collection Time: 07/04/21  6:55 PM   Specimen: BLOOD  Result Value Ref Range Status    Specimen Description BLOOD BLOOD RIGHT HAND  Final   Special Requests   Final    BOTTLES DRAWN AEROBIC AND ANAEROBIC Blood Culture results may not be optimal due to an inadequate volume of blood received in culture bottles   Culture   Final    NO GROWTH 4 DAYS Performed at [facility], [address], [place], [place]    Report Status PENDING  Incomplete  Aerobic Culture w Gram  Stain (superficial specimen)     Status: None   Collection Time: 07/05/21  8:30 AM   Specimen: Ankle  Result Value Ref Range Status   Specimen Description   Final    ANKLE Performed at [facility], [address] [place], [place] [address]    Special Requests   Final    NONE Performed at [facility], [address] [place], [place] [address]    Gram Stain   Final    ABUNDANT WBC PRESENT, PREDOMINANTLY PMN FEW GRAM POSITIVE COCCI RARE GRAM VARIABLE ROD Performed at [facility], [address] [place], [place] [address]    Culture RARE PROTEUS MIRABILIS  Final   Report Status 07/07/2021 FINAL  Final   Organism ID, Bacteria PROTEUS MIRABILIS  Final      Susceptibility   Proteus mirabilis - MIC*    AMPICILLIN <=2 SENSITIVE Sensitive     CEFAZOLIN <=4 SENSITIVE Sensitive     CEFEPIME <=0.12 SENSITIVE Sensitive     CEFTAZIDIME <=1 SENSITIVE Sensitive     CEFTRIAXONE <=0.25 SENSITIVE Sensitive     CIPROFLOXACIN <=0.25 SENSITIVE Sensitive     GENTAMICIN <=1 SENSITIVE Sensitive     IMIPENEM 2 SENSITIVE Sensitive     TRIMETH/SULFA <=20 SENSITIVE Sensitive     AMPICILLIN/SULBACTAM <=2 SENSITIVE Sensitive     PIP/TAZO <=4 SENSITIVE Sensitive     * RARE PROTEUS MIRABILIS     Labs: BNP (last 3 results) No results for input(s): BNP in the last 8760 hours. Basic Metabolic Panel: Recent Labs  Lab 07/04/21 1504 07/05/21 0649 07/06/21 0417 07/07/21 0538 07/08/21 0501  NA 127* 130* 128* 128* 129*  K 3.1* 3.9 3.3* 3.7 3.5  CL 86* 92* 94* 96* 96*  CO2 23  25 28 26 26   GLUCOSE 102* 82 94 97 94  BUN 15 11 9  <5* 7*  CREATININE 0.78 0.78 0.78 0.57* 0.67  CALCIUM 8.8* 8.6* 8.2* 8.4* 8.5*  MG  --   --  1.1* 1.5* 1.4*   Liver Function Tests: Recent Labs  Lab 07/04/21 1504 07/05/21 0649 07/06/21 0417  AST 37 35 36  ALT 23 17 15   ALKPHOS 22* 19* 17*  BILITOT 1.6* 2.0* 1.1  PROT 8.7* 6.8 5.8*  ALBUMIN 3.5 2.7* 2.2*   No results for input(s): LIPASE, AMYLASE in the last 168 hours. No results for input(s): AMMONIA in the last 168 hours. CBC: Recent Labs  Lab 07/04/21 1504 07/05/21 0649 07/08/21 0501  WBC 13.4* 10.6* 8.0  NEUTROABS 8.9*  --  3.4  HGB 11.9* 10.7* 9.3*  HCT 32.6* 29.2* 25.9*  MCV 101.9* 102.1* 100.8*  PLT 235 236 373   Cardiac Enzymes: No results for input(s): CKTOTAL, CKMB, CKMBINDEX, TROPONINI in the last 168 hours. BNP: Invalid input(s): POCBNP CBG: Recent Labs  Lab 07/04/21 2216  GLUCAP 98   D-Dimer No results for input(s): DDIMER in the last 72 hours. Hgb A1c No results for input(s): HGBA1C in the last 72 hours. Lipid Profile No results for input(s): CHOL, HDL, LDLCALC, TRIG, CHOLHDL, LDLDIRECT in the last 72 hours. Thyroid function studies No results for input(s): TSH, T4TOTAL, T3FREE, THYROIDAB in the last 72 hours.  Invalid input(s): FREET3 Anemia work up No results for input(s): VITAMINB12, FOLATE, FERRITIN, TIBC, IRON, RETICCTPCT in the last 72 hours. Urinalysis    Component Value Date/Time   COLORURINE AMBER (A) 07/04/2021 1421   APPEARANCEUR CLEAR (A) 07/04/2021 1421   LABSPEC 1.034 (H) 07/04/2021 1421  PHURINE 5.0 07/04/2021 1421   GLUCOSEU NEGATIVE 07/04/2021 1421   HGBUR NEGATIVE 07/04/2021 1421   BILIRUBINUR NEGATIVE 07/04/2021 1421   KETONESUR 20 (A) 07/04/2021 1421   PROTEINUR NEGATIVE 07/04/2021 1421   NITRITE NEGATIVE 07/04/2021 1421   LEUKOCYTESUR NEGATIVE 07/04/2021 1421   Sepsis Labs Invalid input(s): PROCALCITONIN,  WBC,  LACTICIDVEN Microbiology Recent Results (from  the past 240 hour(s))  SARS CORONAVIRUS 2 (TAT 6-24 HRS) Nasopharyngeal Nasopharyngeal Swab     Status: None   Collection Time: 07/04/21  6:54 PM   Specimen: Nasopharyngeal Swab  Result Value Ref Range Status   SARS Coronavirus 2 NEGATIVE NEGATIVE Final    Comment: (NOTE) SARS-CoV-2 target nucleic acids are NOT DETECTED.  The SARS-CoV-2 RNA is generally detectable in upper and lower respiratory specimens during the acute phase of infection. Negative results do not preclude SARS-CoV-2 infection, do not rule out co-infections with other pathogens, and should not be used as the sole basis for treatment or other patient management decisions. Negative results must be combined with clinical observations, patient history, and epidemiological information. The expected result is Negative.  Fact Sheet for Patients: HairSlick.no  Fact Sheet for Healthcare Providers: quierodirigir.com  This test is not yet approved or cleared by the [place] FDA and  has been authorized for detection and/or diagnosis of SARS-CoV-2 by FDA under an Emergency Use Authorization (EUA). This EUA will remain  in effect (meaning this test can be used) for the duration of the COVID-19 declaration under Se ction 564(b)(1) of the Act, 21 U.S.C. section 360bbb-3(b)(1), unless the authorization is terminated or revoked sooner.  Performed at [facility], [address], [place], [place] [address]   Culture, blood (routine x 2)     Status: None (Preliminary result)   Collection Time: 07/04/21  6:54 PM   Specimen: BLOOD  Result Value Ref Range Status   Specimen Description BLOOD BLOOD LEFT ARM  Final   Special Requests   Final    BOTTLES DRAWN AEROBIC AND ANAEROBIC Blood Culture adequate volume   Culture   Final    NO GROWTH 4 DAYS Performed at [facility], [address], [place], [place] [address]    Report Status PENDING  Incomplete   Culture, blood (routine x 2)     Status: None (Preliminary result)   Collection Time: 07/04/21  6:55 PM   Specimen: BLOOD  Result Value Ref Range Status   Specimen Description BLOOD BLOOD RIGHT HAND  Final   Special Requests   Final    BOTTLES DRAWN AEROBIC AND ANAEROBIC Blood Culture results may not be optimal due to an inadequate volume of blood received in culture bottles   Culture   Final    NO GROWTH 4 DAYS Performed at [facility], [address], [place], [place] [address]    Report Status PENDING  Incomplete  Aerobic Culture w Gram Stain (superficial specimen)     Status: None   Collection Time: 07/05/21  8:30 AM   Specimen: Ankle  Result Value Ref Range Status   Specimen Description   Final    ANKLE Performed at [facility], [address] [place], [place] [address]    Special Requests   Final    NONE Performed at [facility], [address] [place], [place] [address]    Gram Stain   Final    ABUNDANT WBC PRESENT, PREDOMINANTLY PMN FEW GRAM POSITIVE COCCI RARE GRAM VARIABLE ROD Performed at [facility], [address] [place], [place] [address]    Culture RARE PROTEUS MIRABILIS  Final   Report Status 07/07/2021 FINAL  Final   Organism ID, Bacteria PROTEUS MIRABILIS  Final      Susceptibility   Proteus mirabilis - MIC*    AMPICILLIN <=2 SENSITIVE Sensitive     CEFAZOLIN <=4 SENSITIVE Sensitive     CEFEPIME <=0.12 SENSITIVE Sensitive     CEFTAZIDIME <=1 SENSITIVE Sensitive     CEFTRIAXONE <=0.25 SENSITIVE Sensitive     CIPROFLOXACIN <=0.25 SENSITIVE Sensitive     GENTAMICIN <=1 SENSITIVE Sensitive     IMIPENEM 2 SENSITIVE Sensitive     TRIMETH/SULFA <=20 SENSITIVE Sensitive     AMPICILLIN/SULBACTAM <=2 SENSITIVE Sensitive     PIP/TAZO <=4 SENSITIVE Sensitive     * RARE PROTEUS MIRABILIS    Procedures/Studies: DG Ankle Complete Right  Result Date: 07/04/2021 CLINICAL DATA:  Open wound on the right ankle. EXAM:  RIGHT ANKLE - COMPLETE 3+ VIEW COMPARISON:  None. FINDINGS: There is no evidence of fracture, dislocation, or joint effusion. No focal osseous demineralization to suggest osteomyelitis. A plantar calcaneal enthesophyte is noted. The bones are demineralized. There is soft tissue swelling surrounding the ankle. IMPRESSION: No evidence of osteomyelitis. Electronically Signed   By: Romona Curls M.D.   On: 07/04/2021 15:43   MR ANKLE RIGHT W WO CONTRAST  Result Date: 07/05/2021 CLINICAL DATA:  Right ankle wound. EXAM: MRI OF THE RIGHT ANKLE WITHOUT AND WITH CONTRAST TECHNIQUE: Multiplanar, multisequence MR imaging of the ankle was performed before and after the administration of intravenous contrast. CONTRAST:  27mL GADAVIST GADOBUTROL 1 MMOL/ML IV SOLN COMPARISON:  Right ankle x-rays from same day. FINDINGS: TENDONS Peroneal: Peroneal longus tendon intact. Peroneal brevis intact. Posteromedial: Posterior tibial tendon intact. Flexor digitorum longus tendon intact. Flexor hallucis longus tendon intact. Anterior: Tibialis anterior tendon intact. Extensor hallucis longus tendon intact Extensor digitorum longus tendon intact. Achilles:  Intact. Plantar Fascia: Intact. LIGAMENTS Lateral: Anterior talofibular ligament intact. Calcaneofibular ligament intact. Posterior talofibular ligament intact. Anterior and posterior tibiofibular ligaments intact. Medial: Deltoid ligament intact. Spring ligament intact. CARTILAGE Ankle Joint: No joint effusion. Normal ankle mortise. No chondral defect. Subtalar Joints/Sinus Tarsi: Normal subtalar joints. No subtalar joint effusion. Normal sinus tarsi. Bones: No marrow signal abnormality.  No fracture or dislocation. Soft Tissue: Diffuse soft tissue swelling with mild enhancement. No fluid collection or soft tissue mass. IMPRESSION: IMPRESSION 1. Cellulitis. No abscess or osteomyelitis. Electronically Signed   By: Obie Dredge M.D.   On: 07/05/2021 05:05   US Abdomen Limited RUQ  (LIVER/GB)  Result Date: 07/06/2021 CLINICAL DATA:  Abnormal liver function tests EXAM: ULTRASOUND ABDOMEN LIMITED RIGHT UPPER QUADRANT COMPARISON:  None. FINDINGS: Gallbladder: Small layering shadowing gallstones in the nondistended gallbladder, largest 6 mm. No gallbladder wall thickening. No sonographic Murphy's sign. No pericholecystic fluid. Common bile duct: Diameter: 4 mm Liver: Diffusely echogenic liver parenchyma with posterior acoustic attenuation, compatible with diffuse hepatic steatosis. No liver masses, noting decreased sensitivity in the setting of an echogenic liver. No definite liver surface irregularity. Portal vein is patent on color Doppler imaging with normal direction of blood flow towards the liver. Other: None. IMPRESSION: 1. Diffuse hepatic steatosis. 2. Cholelithiasis. No evidence of acute cholecystitis. No biliary ductal dilatation. Electronically Signed   By: Delbert Phenix M.D.   On: 07/06/2021 10:26     Time coordinating discharge: Over 30 minutes    Lewie Chamber, MD  [facility] 07/08/2021, 4:05 PM

## 2021-07-08 NOTE — Progress Notes (Signed)
Patient being assisted with getting ready to discharge. Patient noted to have maggots actively crawling around on his clothing, shoes and personal items. Patient gave permission to discard clothing in trash. Patient expressed wishes to keep his shoes, belt, hat and wallet. Items cleansed to the best of our ability. Shoes inundated with maggots. Shoes and belt triple bagged after cleansing.

## 2021-07-08 NOTE — Progress Notes (Signed)
Patient discharged. Patient awaiting pick up by brother. Brother [name] called by Education administrator. [name] stated he would be unable to pick patient up until around 6pm.

## 2021-07-08 NOTE — TOC Transition Note (Signed)
Transition of Care [facility]) - CM/SW Discharge Note   Patient Details  Name: YAHSIR WICKENS MRN: [id number] Date of Birth: [birth year]  [facility] CM/SW Contact:  Allayne Butcher, RN Phone Number: 07/08/2021, 1:50 PM   Clinical Narrative:    Patient is medically cleared for discharge home today.  Charles the patient's brother will be coming to pick him up this afternoon between 4 and 5.  Medications have been sent over to Medication Management to be filled, they will be picked up and delivered to the patient's room before discharge.  Patient has an appointment over at [facility] tomorrow at 0830- patient's brother reports that they can get him there tomorrow.   Patient will be going to his mother's home.    Final next level of care: Home/Self Care Barriers to Discharge: Barriers Resolved   Patient Goals and CMS Choice        Discharge Placement                       Discharge Plan and Services   Discharge Planning Services: CM Consult, [facility], Follow-up appt scheduled            DME Arranged: N/A DME Agency: NA                  Social Determinants of Health (SDOH) Interventions     Readmission Risk Interventions No flowsheet data found.

## 2021-07-09 ENCOUNTER — Ambulatory Visit: Payer: Self-pay | Admitting: Physician Assistant

## 2021-07-09 ENCOUNTER — Other Ambulatory Visit: Payer: Self-pay

## 2021-07-09 LAB — CBC WITH DIFFERENTIAL/PLATELET
Abs Immature Granulocytes: 0.34 10*3/uL — ABNORMAL HIGH (ref 0.00–0.07)
Basophils Absolute: 0.1 10*3/uL (ref 0.0–0.1)
Basophils Relative: 1 %
Eosinophils Absolute: 0.2 10*3/uL (ref 0.0–0.5)
Eosinophils Relative: 2 %
HCT: 26.4 % — ABNORMAL LOW (ref 39.0–52.0)
Hemoglobin: 9.6 g/dL — ABNORMAL LOW (ref 13.0–17.0)
Immature Granulocytes: 3 %
Lymphocytes Relative: 28 %
Lymphs Abs: 2.8 10*3/uL (ref 0.7–4.0)
MCH: 37.4 pg — ABNORMAL HIGH (ref 26.0–34.0)
MCHC: 36.4 g/dL — ABNORMAL HIGH (ref 30.0–36.0)
MCV: 102.7 fL — ABNORMAL HIGH (ref 80.0–100.0)
Monocytes Absolute: 1.6 10*3/uL — ABNORMAL HIGH (ref 0.1–1.0)
Monocytes Relative: 16 %
Neutro Abs: 4.9 10*3/uL (ref 1.7–7.7)
Neutrophils Relative %: 50 %
Platelets: 483 10*3/uL — ABNORMAL HIGH (ref 150–400)
RBC: 2.57 MIL/uL — ABNORMAL LOW (ref 4.22–5.81)
RDW: 12.7 % (ref 11.5–15.5)
Smear Review: NORMAL
WBC: 10 10*3/uL (ref 4.0–10.5)
nRBC: 0 % (ref 0.0–0.2)

## 2021-07-09 LAB — CULTURE, BLOOD (ROUTINE X 2)
Culture: NO GROWTH
Culture: NO GROWTH
Special Requests: ADEQUATE

## 2021-07-09 LAB — BASIC METABOLIC PANEL
Anion gap: 7 (ref 5–15)
BUN: 8 mg/dL (ref 8–23)
CO2: 26 mmol/L (ref 22–32)
Calcium: 9.1 mg/dL (ref 8.9–10.3)
Chloride: 97 mmol/L — ABNORMAL LOW (ref 98–111)
Creatinine, Ser: 0.62 mg/dL (ref 0.61–1.24)
GFR, Estimated: >60 mL/min (ref >60)
Glucose, Bld: 98 mg/dL (ref 70–99)
Potassium: 3.5 mmol/L (ref 3.5–5.1)
Sodium: 130 mmol/L — ABNORMAL LOW (ref 135–145)

## 2021-07-09 LAB — MAGNESIUM: Magnesium: 1.4 mg/dL — ABNORMAL LOW (ref 1.7–2.4)

## 2021-07-09 NOTE — TOC Progression Note (Signed)
Transition of Care [facility]) - Progression Note    Patient Details  Name: EWELL BENASSI MRN: [id number] Date of Birth: 1961/06/26  Transition of Care [facility]) CM/SW Contact  Allayne Butcher, RN Phone Number: 07/09/2021, 9:26 AM  Clinical Narrative:    Patient's brother reports that he was unable to get a ride yesterday to pick up the patient.  [name] says that his cousin is coming to pick him up at 1030 and they should be at the hospital around 11 am to get the patient.  Medications will need to be picked up at Medication management once the patient discharges- one of the medications was unavailable yesterday but will be in today.     Expected Discharge Plan: Home/Self Care Barriers to Discharge: Barriers Resolved  Expected Discharge Plan and Services Expected Discharge Plan: Home/Self Care   Discharge Planning Services: CM Consult, [facility], Follow-up appt scheduled   Living arrangements for the past 2 months: Single Family Home Expected Discharge Date: 07/08/21               DME Arranged: N/A DME Agency: NA                   Social Determinants of Health (SDOH) Interventions    Readmission Risk Interventions No flowsheet data found.

## 2021-07-09 NOTE — TOC Progression Note (Signed)
Transition of Care [facility]) - Progression Note    Patient Details  Name: Adam Neal MRN: [id number] Date of Birth: February 19, 1961  [facility] CM/SW Contact  Allayne Butcher, RN Phone Number: 07/09/2021, 9:29 AM  Clinical Narrative:    Per bedside RN patient's sister is picking him up now.    Expected Discharge Plan: Home/Self Care Barriers to Discharge: Barriers Resolved  Expected Discharge Plan and Services Expected Discharge Plan: Home/Self Care   Discharge Planning Services: CM Consult, [facility], Follow-up appt scheduled   Living arrangements for the past 2 months: Single Family Home Expected Discharge Date: 07/08/21               DME Arranged: N/A DME Agency: NA                   Social Determinants of Health (SDOH) Interventions    Readmission Risk Interventions No flowsheet data found.

## 2021-07-12 ENCOUNTER — Other Ambulatory Visit: Payer: Self-pay

## 2021-07-19 ENCOUNTER — Telehealth: Payer: Self-pay | Admitting: Pharmacy Technician

## 2021-07-19 NOTE — Telephone Encounter (Signed)
Provided patient with new patient packet to obtain [facility] services.  [facility] must receive requested financial documentation within 30 days in order to determine eligibility and provide medication assistance.  Moon Budde J. Quanta Roher Care Manager [facility]  

## 2021-08-02 ENCOUNTER — Telehealth: Payer: Self-pay | Admitting: Pharmacy Technician

## 2021-08-02 NOTE — Telephone Encounter (Signed)
Provided patient with new patient packet to obtain ongoing [facility] services.  [facility] must receive requested financial documentation within 30 days in order to determine eligibility and provide medication assistance.  Sherilyn Dacosta Care Manager [facility]

## 2021-08-05 ENCOUNTER — Telehealth: Payer: Self-pay | Admitting: Gerontology

## 2021-08-05 NOTE — Telephone Encounter (Signed)
Invalid number

## 2021-10-21 DEATH — deceased

## 2023-07-25 IMAGING — CR DG ANKLE COMPLETE 3+V*R*
3 series · 3 of 3 positions shown · non-contrast
Comparison: None.

CLINICAL DATA: Open wound on the right ankle.

EXAM:
RIGHT ANKLE - COMPLETE 3+ VIEW

[ankle ap]
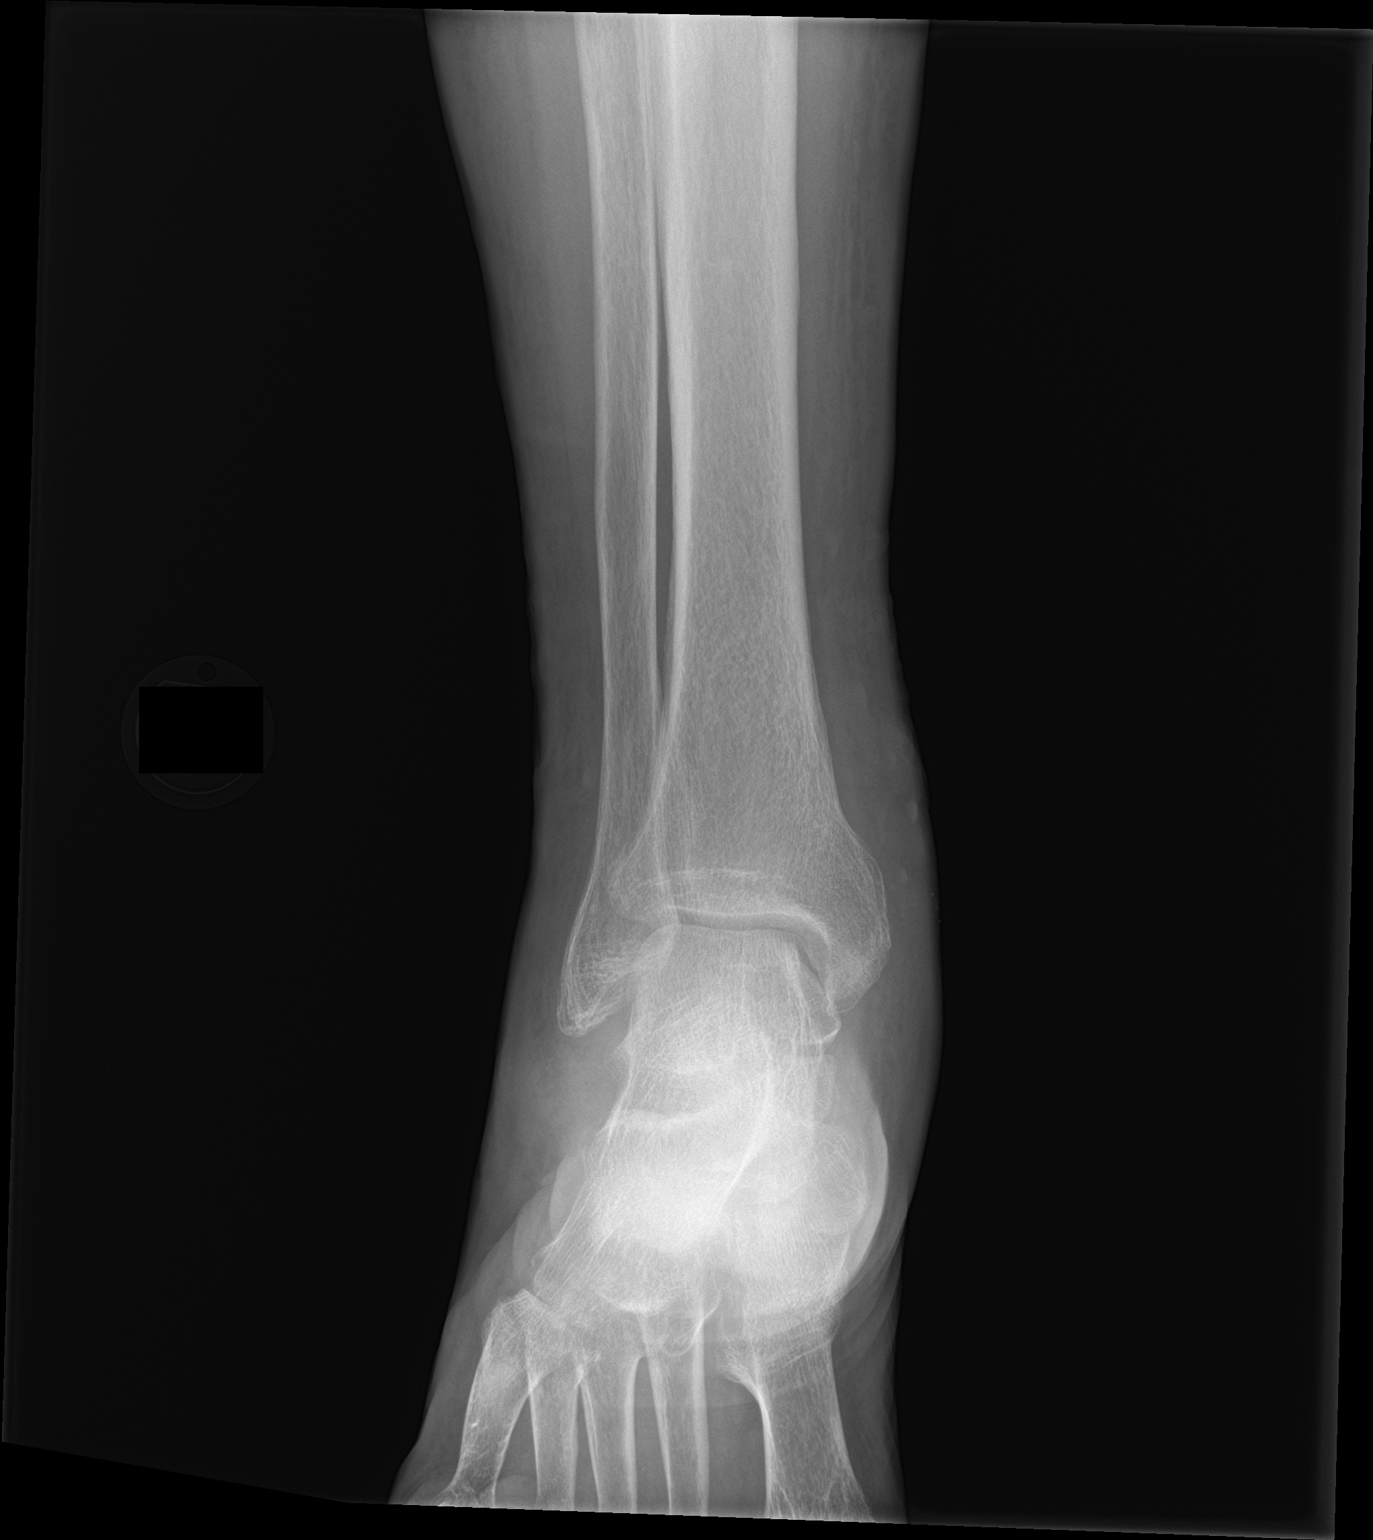

[ankle obl]
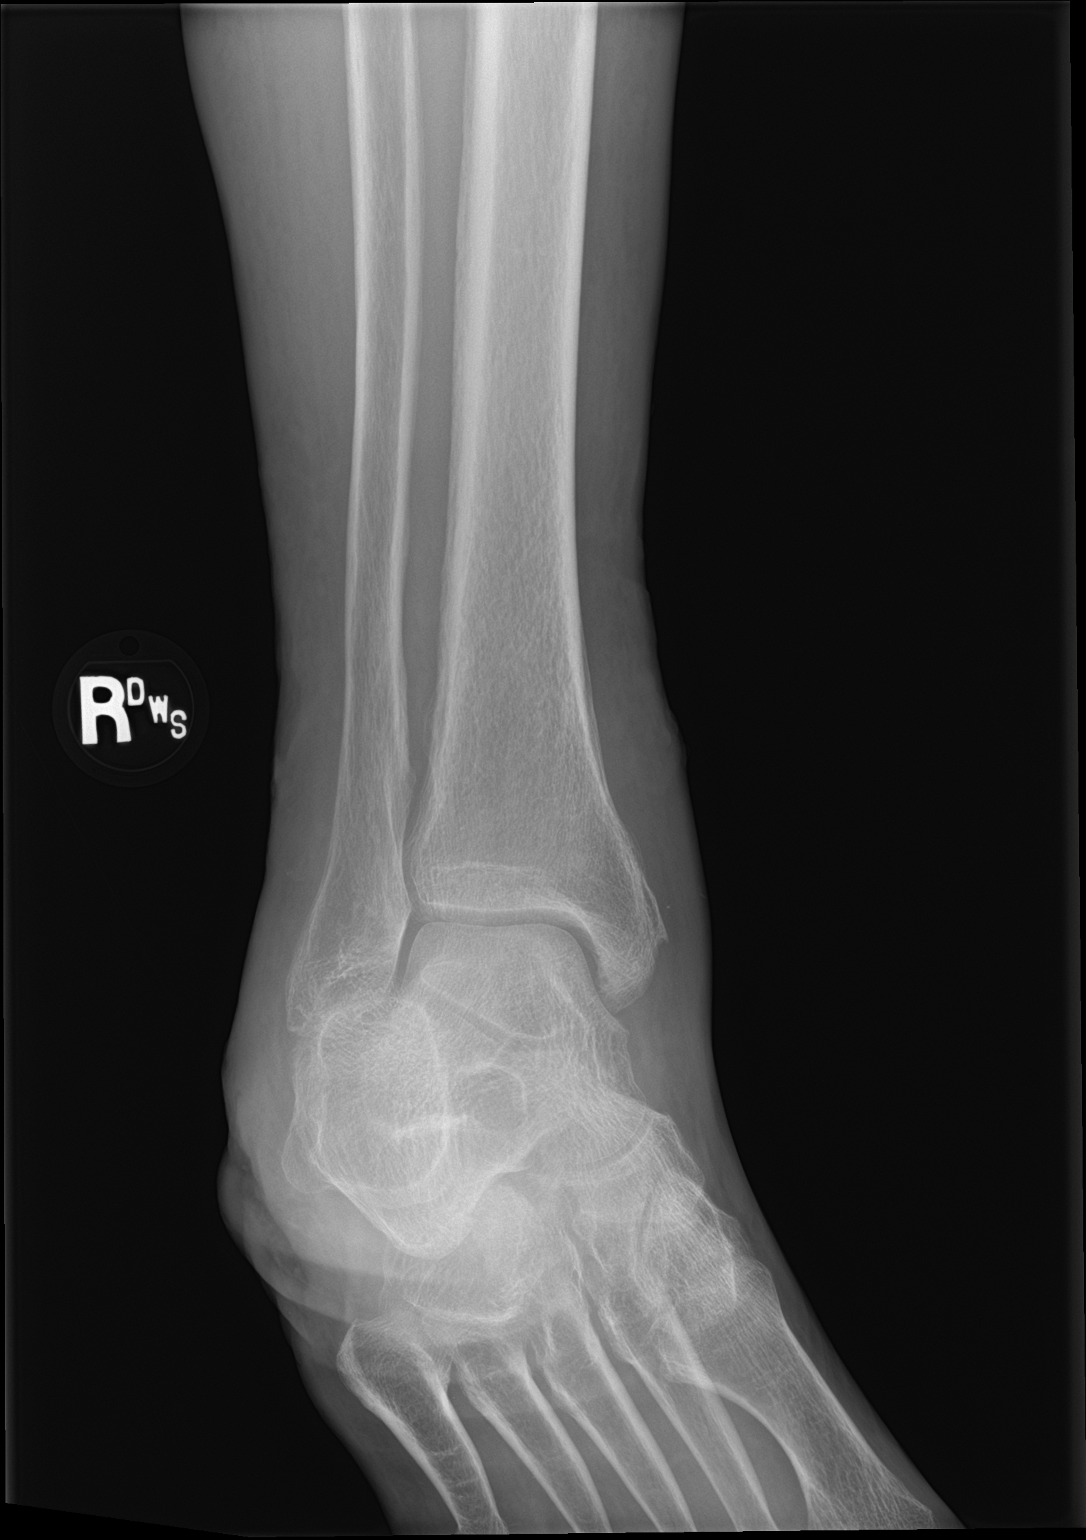

[ankle lat]
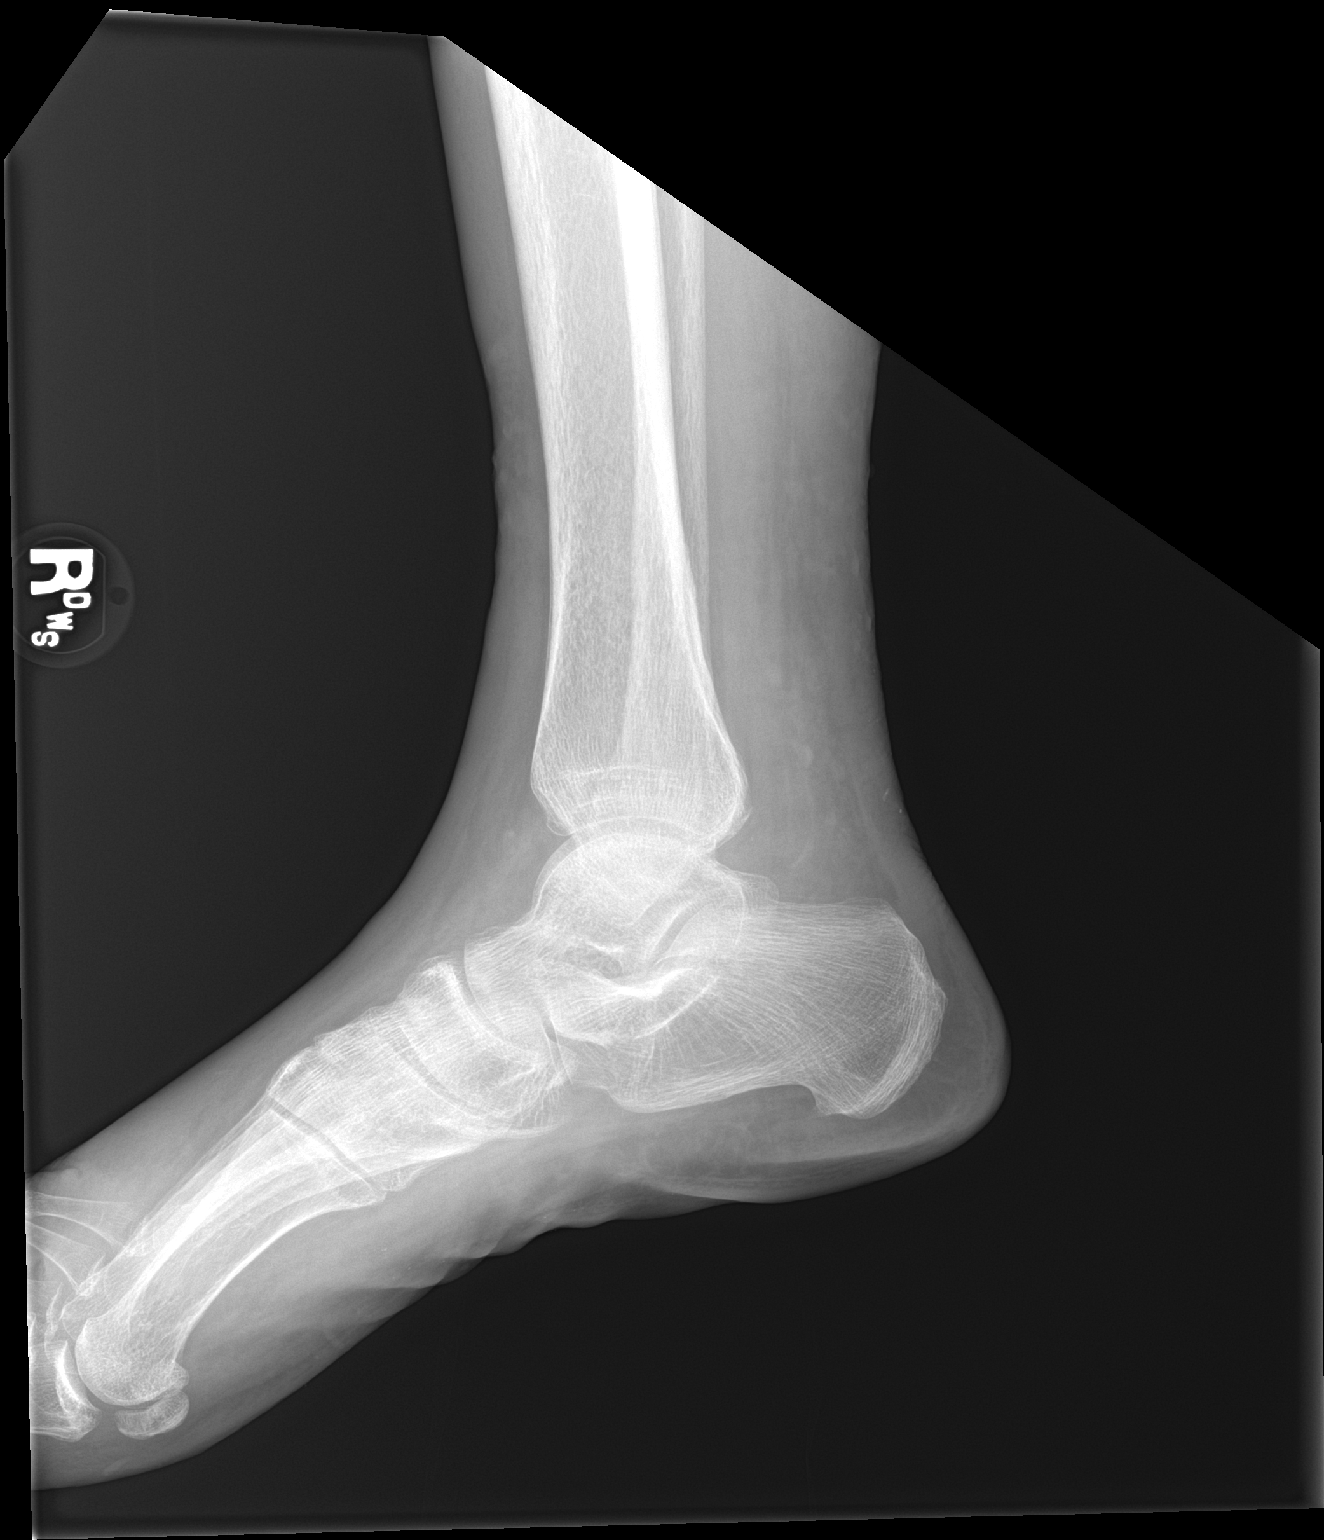

[3 of 3 positions shown; findings below may reference images not displayed]

FINDINGS: There is no evidence of fracture, dislocation, or joint effusion. No
focal osseous demineralization to suggest osteomyelitis. A plantar
calcaneal enthesophyte is noted. The bones are demineralized. There
is soft tissue swelling surrounding the ankle.
IMPRESSION: No evidence of osteomyelitis.
